# Patient Record
Sex: Male | Born: 1950 | Race: Black or African American | Hispanic: No | Marital: Single | Smoking: Current every day smoker
Health system: Southern US, Community
[De-identification: ages and names within clinical notes are randomized; demographics above are authoritative.]

## PROBLEM LIST (undated history)

## (undated) DIAGNOSIS — I10 Essential (primary) hypertension: Secondary | ICD-10-CM

## (undated) HISTORY — PX: OTHER SURGICAL HISTORY: SHX169

---

## 2005-03-23 ENCOUNTER — Emergency Department: Payer: Self-pay | Admitting: Emergency Medicine

## 2007-05-10 ENCOUNTER — Emergency Department: Payer: Self-pay | Admitting: Emergency Medicine

## 2007-05-12 ENCOUNTER — Ambulatory Visit: Payer: Self-pay | Admitting: Emergency Medicine

## 2011-01-14 ENCOUNTER — Emergency Department: Payer: Self-pay | Admitting: Emergency Medicine

## 2011-04-06 ENCOUNTER — Emergency Department: Payer: Self-pay | Admitting: Emergency Medicine

## 2011-08-26 ENCOUNTER — Emergency Department: Payer: Self-pay | Admitting: *Deleted

## 2013-02-14 ENCOUNTER — Emergency Department: Payer: Self-pay | Admitting: Emergency Medicine

## 2013-04-21 ENCOUNTER — Emergency Department: Payer: Self-pay | Admitting: Emergency Medicine

## 2013-04-21 LAB — COMPREHENSIVE METABOLIC PANEL
Bilirubin,Total: 0.9 mg/dL (ref 0.2–1.0)
Calcium, Total: 9.7 mg/dL (ref 8.5–10.1)
Chloride: 98 mmol/L (ref 98–107)
Co2: 29 mmol/L (ref 21–32)
Creatinine: 0.97 mg/dL (ref 0.60–1.30)
EGFR (African American): >60
Glucose: 92 mg/dL (ref 65–99)
Osmolality: 265 (ref 275–301)
SGOT(AST): 62 U/L — ABNORMAL HIGH (ref 15–37)
SGPT (ALT): 64 U/L (ref 12–78)
Sodium: 131 mmol/L — ABNORMAL LOW (ref 136–145)

## 2019-06-05 ENCOUNTER — Emergency Department
Admission: EM | Admit: 2019-06-05 | Discharge: 2019-06-05 | Disposition: A | Discharge status: Home / Self Care | Payer: Medicare Other | Attending: Emergency Medicine | Admitting: Emergency Medicine

## 2019-06-05 ENCOUNTER — Other Ambulatory Visit: Payer: Self-pay

## 2019-06-05 ENCOUNTER — Encounter: Payer: Self-pay | Admitting: Emergency Medicine

## 2019-06-05 DIAGNOSIS — Y93E1 Activity, personal bathing and showering: Secondary | ICD-10-CM | POA: Insufficient documentation

## 2019-06-05 DIAGNOSIS — Y92012 Bathroom of single-family (private) house as the place of occurrence of the external cause: Secondary | ICD-10-CM | POA: Diagnosis not present

## 2019-06-05 DIAGNOSIS — F17210 Nicotine dependence, cigarettes, uncomplicated: Secondary | ICD-10-CM | POA: Diagnosis not present

## 2019-06-05 DIAGNOSIS — W182XXA Fall in (into) shower or empty bathtub, initial encounter: Secondary | ICD-10-CM | POA: Insufficient documentation

## 2019-06-05 DIAGNOSIS — R0781 Pleurodynia: Secondary | ICD-10-CM | POA: Insufficient documentation

## 2019-06-05 DIAGNOSIS — Y998 Other external cause status: Secondary | ICD-10-CM | POA: Insufficient documentation

## 2019-06-05 DIAGNOSIS — R0789 Other chest pain: Secondary | ICD-10-CM

## 2019-06-05 DIAGNOSIS — W19XXXA Unspecified fall, initial encounter: Secondary | ICD-10-CM

## 2019-06-05 MED ORDER — MELOXICAM 15 MG PO TABS: 15.0000 mg | ORAL_TABLET | Freq: Every day | ORAL | 0 refills | Status: DC

## 2019-06-05 MED ORDER — TRAMADOL HCL 50 MG PO TABS
50.0000 mg | ORAL_TABLET | Freq: Once | ORAL | Status: AC
  Administered 2019-06-05: 50 mg via ORAL
  Filled 2019-06-05: qty 1

## 2019-06-05 MED ORDER — MELOXICAM 7.5 MG PO TABS
15.0000 mg | ORAL_TABLET | Freq: Once | ORAL | Status: AC
  Administered 2019-06-05: 22:00:00 15 mg via ORAL
  Filled 2019-06-05: qty 2

## 2019-06-05 NOTE — ED Triage Notes (Signed)
Pt presents to ED via EMS from home with right sided back pain after he fell 3 days ago in the bathroom. Pt states he was using the restroom and the rug slipped and he hit the right side of his back on the tub. Pt denies hitting his head or loc. Pt states he has been using Advil at home with good relief but reports he does not have any left. Pt ambulatory with steady gait. No distress noted.

## 2019-06-05 NOTE — ED Provider Notes (Signed)
[facility] Emergency Department Provider Note  ____________________________________________  Time seen: Approximately 9:45 PM  I have reviewed the triage vital signs and the nursing notes.   HISTORY  Chief Complaint Back Pain    HPI John Holden is a 68 y.o. male who presents the emergency department for evaluation of right-sided rib pain.  Patient reports that he was in the shower/bathtub a week ago when he fell landing on his right wrist.  Patient reports that he has had pain to the ribs for the past week.  He reports that it is improving every day.  Patient states that he has been taking Advil which is helped tremendously with the pain.  He states that he is here because he ran out of Advil and just "needs some medicine so I can go home and be comfortable."  Patient did not hit his head or lose consciousness.  He denies any shortness of breath or difficulty breathing.  No coughing.  Patient denies any abdominal pain, flank pain, hematuria.  No pain in bilateral hips.  No radicular symptoms.  No bowel or bladder dysfunction, saddle anesthesia or paresthesias.         History reviewed. No pertinent past medical history.  There are no active problems to display for this patient.   History reviewed. No pertinent surgical history.  Prior to Admission medications   Medication Sig Start Date End Date Taking? Authorizing Provider  meloxicam (MOBIC) 15 MG tablet Take 1 tablet (15 mg total) by mouth daily. 06/05/19   Jeralynn Vaquera, Delorise RoyalsJonathan D, PA-C    Allergies Patient has no known allergies.  No family history on file.  Social History Social History   Tobacco Use  . Smoking status: Current Every Day Smoker    Types: Cigarettes  . Smokeless tobacco: Never Used  Substance Use Topics  . Alcohol use: Yes    Comment: 40oz daily  . Drug use: Never     Review of Systems  Constitutional: No fever/chills Eyes: No visual changes. No discharge ENT: No upper  respiratory complaints. Cardiovascular: no chest pain. Respiratory: no cough. No SOB. Gastrointestinal: No abdominal pain.  No nausea, no vomiting.  No diarrhea.  No constipation. Genitourinary: Negative for dysuria. No hematuria Musculoskeletal: Positive for right rib pain Skin: Negative for rash, abrasions, lacerations, ecchymosis. Neurological: Negative for headaches, focal weakness or numbness. 10-point ROS otherwise negative.  ____________________________________________   PHYSICAL EXAM:  VITAL SIGNS: ED Triage Vitals  Enc Vitals Group     BP 06/05/19 2118 (!) 174/83     Pulse Rate 06/05/19 2118 96     Resp 06/05/19 2118 18     Temp 06/05/19 2118 97.9 F (36.6 C)     Temp Source 06/05/19 2118 Oral     SpO2 06/05/19 2118 98 %     Weight 06/05/19 2119 130 lb (59 kg)     Height 06/05/19 2119 5\' 7"  (1.702 m)     Head Circumference --      Peak Flow --      Pain Score 06/05/19 2119 8     Pain Loc --      Pain Edu? --      Excl. in GC? --      Constitutional: Alert and oriented. Well appearing and in no acute distress. Eyes: Conjunctivae are normal. PERRL. EOMI. Head: Atraumatic. ENT:      Ears:       Nose: No congestion/rhinnorhea.      Mouth/Throat: Mucous membranes  are moist.  Neck: No stridor.  No cervical spine tenderness to palpation.  Cardiovascular: Normal rate, regular rhythm. Normal S1 and S2.  Good peripheral circulation. Respiratory: Normal respiratory effort without tachypnea or retractions. Lungs CTAB. Good air entry to the bases with no decreased or absent breath sounds. Gastrointestinal: Bowel sounds 4 quadrants. Soft and nontender to palpation. No guarding or rigidity. No palpable masses. No distention. No CVA tenderness Musculoskeletal: Full range of motion to all extremities. No gross deformities appreciated.  Visualization of patient's ribs reveals no visible signs of trauma with open wounds, ecchymosis, edema, paradoxical chest wall movement.   Palpation reveals diffuse tenderness to palpation over the lateral ribs without point specific tenderness.  No palpable abnormality or crepitus.  No subcutaneous emphysema.  Good underlying breath sounds bilaterally. Neurologic:  Normal speech and language. No gross focal neurologic deficits are appreciated.  Skin:  Skin is warm, dry and intact. No rash noted. Psychiatric: Mood and affect are normal. Speech and behavior are normal. Patient exhibits appropriate insight and judgement.   ____________________________________________   LABS (all labs ordered are listed, but only abnormal results are displayed)  Labs Reviewed - No data to display ____________________________________________  EKG   ____________________________________________  RADIOLOGY   No results found.  ____________________________________________    PROCEDURES  Procedure(s) performed:    Procedures    Medications  traMADol (ULTRAM) tablet 50 mg (has no administration in time range)  meloxicam (MOBIC) tablet 15 mg (has no administration in time range)     ____________________________________________   INITIAL IMPRESSION / ASSESSMENT AND PLAN / ED COURSE  Pertinent labs & imaging results that were available during my care of the patient were reviewed by me and considered in my medical decision making (see chart for details).  Review of the [place] CSRS was performed in accordance of the [facility] prior to dispensing any controlled drugs.  Clinical Course as of Jun 04 2149  Sheral Flow Jun 05, 2019  2147 Patient presented to the emergency department for medication for his rib pain after falling a week ago.  Patient states that he has been taking Advil but he ran out.  Patient presents emergency department for medication to help with his pain.  Patient did not hit his head or lose consciousness.  No concerning signs or symptoms.  Exam is reassuring.  I recommended imaging to further evaluate the patient's ribs and the  patient declined stating that "I have kids waiting in the car and I need to leave."  Patient is requesting medication and discharge.  I have informed patient that I cannot fully assess him without any imaging.  Patient verbalizes understanding of the risk of not pursuing imaging.   [JC]    Clinical Course User Index [JC] Katheren Jimmerson, Delorise Royals, PA-C          Patient's diagnosis is consistent with rib pain from fall.  Patient presented to emergency department complaining of right rib pain.  Fall occurred a week ago.  Patient is just requesting medication as he ran out of Advil at home.  Overall exam is reassuring.  I recommended imaging but patient adamantly declines.  Patient states that he is here for medication only.  Patient is given meloxicam and Ultram in the emergency department.  Patient will be prescribed meloxicam at home.  I discussed the risk of foregoing further work-up and patient verbalizes understanding of same.  Patient still requests to leave.  Prescription provided for patient.  Follow-up primary care as needed..  Patient  is given ED precautions to return to the ED for any worsening or new symptoms.     ____________________________________________  FINAL CLINICAL IMPRESSION(S) / ED DIAGNOSES  Final diagnoses:  Rib pain on right side  Fall, initial encounter      NEW MEDICATIONS STARTED DURING THIS VISIT:  ED Discharge Orders         Ordered    meloxicam (MOBIC) 15 MG tablet  Daily     06/05/19 2148              This chart was dictated using voice recognition software/Dragon. Despite best efforts to proofread, errors can occur which can change the meaning. Any change was purely unintentional.    Darletta Moll, PA-C 06/05/19 2150    Nena Polio, MD 06/05/19 [phone]

## 2019-06-07 ENCOUNTER — Encounter: Payer: Self-pay | Admitting: Emergency Medicine

## 2019-06-07 ENCOUNTER — Other Ambulatory Visit: Payer: Self-pay

## 2019-06-07 ENCOUNTER — Emergency Department: Payer: Medicare Other

## 2019-06-07 ENCOUNTER — Emergency Department
Admission: EM | Admit: 2019-06-07 | Discharge: 2019-06-07 | Disposition: A | Discharge status: Home / Self Care | Payer: Medicare Other | Attending: Student | Admitting: Student

## 2019-06-07 DIAGNOSIS — F17210 Nicotine dependence, cigarettes, uncomplicated: Secondary | ICD-10-CM | POA: Diagnosis not present

## 2019-06-07 DIAGNOSIS — Z9114 Patient's other noncompliance with medication regimen: Secondary | ICD-10-CM | POA: Insufficient documentation

## 2019-06-07 DIAGNOSIS — Z79899 Other long term (current) drug therapy: Secondary | ICD-10-CM | POA: Diagnosis not present

## 2019-06-07 DIAGNOSIS — Z91199 Patient's noncompliance with other medical treatment and regimen due to unspecified reason: Secondary | ICD-10-CM

## 2019-06-07 DIAGNOSIS — Z9119 Patient's noncompliance with other medical treatment and regimen: Secondary | ICD-10-CM

## 2019-06-07 DIAGNOSIS — Y92012 Bathroom of single-family (private) house as the place of occurrence of the external cause: Secondary | ICD-10-CM | POA: Diagnosis not present

## 2019-06-07 DIAGNOSIS — S299XXA Unspecified injury of thorax, initial encounter: Secondary | ICD-10-CM | POA: Diagnosis present

## 2019-06-07 DIAGNOSIS — I10 Essential (primary) hypertension: Secondary | ICD-10-CM | POA: Diagnosis not present

## 2019-06-07 DIAGNOSIS — Y93E8 Activity, other personal hygiene: Secondary | ICD-10-CM | POA: Insufficient documentation

## 2019-06-07 DIAGNOSIS — Y999 Unspecified external cause status: Secondary | ICD-10-CM | POA: Diagnosis not present

## 2019-06-07 DIAGNOSIS — Z760 Encounter for issue of repeat prescription: Secondary | ICD-10-CM | POA: Diagnosis not present

## 2019-06-07 DIAGNOSIS — S2231XA Fracture of one rib, right side, initial encounter for closed fracture: Secondary | ICD-10-CM | POA: Diagnosis not present

## 2019-06-07 DIAGNOSIS — W01198A Fall on same level from slipping, tripping and stumbling with subsequent striking against other object, initial encounter: Secondary | ICD-10-CM | POA: Diagnosis not present

## 2019-06-07 LAB — COMPREHENSIVE METABOLIC PANEL
ALT: 42 U/L (ref 0–44)
AST: 115 U/L — ABNORMAL HIGH (ref 15–41)
Albumin: 2.8 g/dL — ABNORMAL LOW (ref 3.5–5.0)
Alkaline Phosphatase: 60 U/L (ref 38–126)
Anion gap: 12 (ref 5–15)
BUN: 9 mg/dL (ref 8–23)
CO2: 21 mmol/L — ABNORMAL LOW (ref 22–32)
Calcium: 8.7 mg/dL — ABNORMAL LOW (ref 8.9–10.3)
Chloride: 105 mmol/L (ref 98–111)
Creatinine, Ser: 0.88 mg/dL (ref 0.61–1.24)
GFR calc Af Amer: >60 mL/min (ref >60)
GFR calc non Af Amer: >60 mL/min (ref >60)
Glucose, Bld: 101 mg/dL — ABNORMAL HIGH (ref 70–99)
Potassium: 4 mmol/L (ref 3.5–5.1)
Sodium: 138 mmol/L (ref 135–145)
Total Bilirubin: 4.4 mg/dL — ABNORMAL HIGH (ref 0.3–1.2)
Total Protein: 8.1 g/dL (ref 6.5–8.1)

## 2019-06-07 LAB — URINALYSIS, COMPLETE (UACMP) WITH MICROSCOPIC
Bacteria, UA: NONE SEEN
Bilirubin Urine: NEGATIVE
Glucose, UA: NEGATIVE mg/dL
Hgb urine dipstick: NEGATIVE
Ketones, ur: 20 mg/dL — AB
Leukocytes,Ua: NEGATIVE
Nitrite: NEGATIVE
Protein, ur: NEGATIVE mg/dL
Specific Gravity, Urine: 1.021 (ref 1.005–1.030)
pH: 5 (ref 5.0–8.0)

## 2019-06-07 LAB — CBC WITH DIFFERENTIAL/PLATELET
Abs Immature Granulocytes: 0.02 10*3/uL (ref 0.00–0.07)
Basophils Absolute: 0 10*3/uL (ref 0.0–0.1)
Basophils Relative: 0 %
Eosinophils Absolute: 0 10*3/uL (ref 0.0–0.5)
Eosinophils Relative: 1 %
HCT: 32.4 % — ABNORMAL LOW (ref 39.0–52.0)
Hemoglobin: 10.5 g/dL — ABNORMAL LOW (ref 13.0–17.0)
Immature Granulocytes: 0 %
Lymphocytes Relative: 17 %
Lymphs Abs: 1 10*3/uL (ref 0.7–4.0)
MCH: 30.2 pg (ref 26.0–34.0)
MCHC: 32.4 g/dL (ref 30.0–36.0)
MCV: 93.1 fL (ref 80.0–100.0)
Monocytes Absolute: 0.7 10*3/uL (ref 0.1–1.0)
Monocytes Relative: 12 %
Neutro Abs: 4.1 10*3/uL (ref 1.7–7.7)
Neutrophils Relative %: 70 %
Platelets: 58 10*3/uL — ABNORMAL LOW (ref 150–400)
RBC: 3.48 MIL/uL — ABNORMAL LOW (ref 4.22–5.81)
RDW: 21.6 % — ABNORMAL HIGH (ref 11.5–15.5)
Smear Review: NORMAL
WBC: 5.9 10*3/uL (ref 4.0–10.5)
nRBC: 0 % (ref 0.0–0.2)

## 2019-06-07 LAB — LIPASE, BLOOD: Lipase: 35 U/L (ref 11–51)

## 2019-06-07 MED ORDER — TRAMADOL HCL 50 MG PO TABS: 50.0000 mg | ORAL_TABLET | Freq: Four times a day (QID) | ORAL | 0 refills | Status: DC | PRN

## 2019-06-07 MED ORDER — HYDROCHLOROTHIAZIDE 25 MG PO TABS: 25.0000 mg | ORAL_TABLET | Freq: Every day | ORAL | 1 refills | Status: DC

## 2019-06-07 NOTE — Discharge Instructions (Signed)
Call today make an appointment with your primary care provider at [facility] as listed inside your chart.  You need to establish a primary care provider and follow up with them 3-4 times a year due to your hypertension and elevated liver enzymes.  The pressure medication prescribed for you at this time is temporary until you can see your primary care provider at which time most likely they will change her blood pressure medication to something different.  Pain medication is to be taken only as directed every 6 hours if needed for pain.  If any continued pain medication is needed you will need to see your primary care provider.

## 2019-06-07 NOTE — ED Triage Notes (Addendum)
Seen here for same 2 days ago.  No better.  Right lower rib pain.  No cough  No fever  Also is out of bp meds.

## 2019-06-07 NOTE — ED Provider Notes (Signed)
[facility] Emergency Department Provider Note  ____________________________________________   First MD Initiated Contact with Patient 06/07/19 1117     (approximate)  I have reviewed the triage vital signs and the nursing notes.   HISTORY  Chief Complaint Rib Injury and Medication Refill   HPI John Holden is a 68 y.o. male presents to the ED with complaint of right rib pain.  Patient states that he was here 2 days ago did not stay for evaluation of a possible fractured rib.  He was given a prescription for meloxicam which he states has not helped with his pain.  He also is requesting that his blood pressure medication be refilled.  He does not know the name of his blood pressure medication.  He has not seen his PCP in over a year.  He currently denies any symptoms of chest pain, shortness of breath or headache.  He rates his rib pain as a 7 out of 10.        History reviewed. No pertinent past medical history.  There are no active problems to display for this patient.   History reviewed. No pertinent surgical history.  Prior to Admission medications   Medication Sig Start Date End Date Taking? Authorizing Provider  hydrochlorothiazide (HYDRODIURIL) 25 MG tablet Take 1 tablet (25 mg total) by mouth daily. 06/07/19   John Holden, John Cloe L, PA-C  meloxicam (MOBIC) 15 MG tablet Take 1 tablet (15 mg total) by mouth daily. 06/05/19   Cuthriell, John RoyalsJonathan D, PA-C  traMADol (ULTRAM) 50 MG tablet Take 1 tablet (50 mg total) by mouth every 6 (six) hours as needed. 06/07/19   John Holden, John Dolney L, PA-C    Allergies Patient has no known allergies.  No family history on file.  Social History Social History   Tobacco Use   Smoking status: Current Every Day Smoker    Types: Cigarettes   Smokeless tobacco: Never Used  Substance Use Topics   Alcohol use: Yes    Comment: 40oz daily   Drug use: Never    Review of Systems Constitutional: No fever/chills Eyes:  No visual changes. Cardiovascular: Denies chest pain. Respiratory: Denies shortness of breath.   Gastrointestinal: No abdominal pain.  No nausea, no vomiting.  Musculoskeletal: Positive for right rib pain. Skin: Negative for rash. Neurological: Negative for headaches, focal weakness or numbness. ___________________________________________   PHYSICAL EXAM:  VITAL SIGNS: ED Triage Vitals  Enc Vitals Group     BP 06/07/19 1105 (!) 196/76     Pulse Rate 06/07/19 1105 (!) 106     Resp 06/07/19 1105 16     Temp 06/07/19 1105 99 F (37.2 C)     Temp Source 06/07/19 1105 Oral     SpO2 06/07/19 1105 100 %     Weight 06/07/19 1107 130 lb 1.1 oz (59 kg)     Height 06/07/19 1107 5\' 7"  (1.702 m)     Head Circumference --      Peak Flow --      Pain Score 06/07/19 1105 7     Pain Loc --      Pain Edu? --      Excl. in GC? --    Constitutional: Alert and oriented. Well appearing and in no acute distress. Eyes: Conjunctivae are normal.  Head: Atraumatic. Neck: No stridor.   Cardiovascular: Normal rate, regular rhythm. Grossly normal heart sounds.  Good peripheral circulation. Respiratory: Normal respiratory effort.  No retractions. Lungs CTAB.  Moderate tenderness on  palpation of the right lateral rib approximately 10th or 11th without deformity noted.  There is no ecchymosis or abrasions seen.  There is no soft tissue edema present. Gastrointestinal: Soft and nontender. No distention.  Musculoskeletal: Moves upper and lower extremities without any difficulty normal gait was noted.  No tenderness is noted on palpation of the thoracic spine. Neurologic:  Normal speech and language. No gross focal neurologic deficits are appreciated. No gait instability. Skin:  Skin is warm, dry and intact. No rash noted. Psychiatric: Mood and affect are normal. Speech and behavior are normal.  ____________________________________________   LABS (all labs ordered are listed, but only abnormal results are  displayed)  Labs Reviewed  CBC WITH DIFFERENTIAL/PLATELET - Abnormal; Notable for the following components:      Result Value   RBC 3.48 (*)    Hemoglobin 10.5 (*)    HCT 32.4 (*)    RDW 21.6 (*)    Platelets 58 (*)    All other components within normal limits  COMPREHENSIVE METABOLIC PANEL - Abnormal; Notable for the following components:   CO2 21 (*)    Glucose, Bld 101 (*)    Calcium 8.7 (*)    Albumin 2.8 (*)    AST 115 (*)    Total Bilirubin 4.4 (*)    All other components within normal limits  URINALYSIS, COMPLETE (UACMP) WITH MICROSCOPIC - Abnormal; Notable for the following components:   Color, Urine AMBER (*)    APPearance CLEAR (*)    Ketones, ur 20 (*)    All other components within normal limits  LIPASE, BLOOD   ____________________________________________  EKG EKG was reviewed by Dr. Mayford Knife.  Normal sinus rhythm with a ventricular rate of 98, PR interval 182, QRS duration 86  ____________________________________________  RADIOLOGY  Official radiology report(s): Dg Ribs Unilateral W/chest Right  Result Date: 06/07/2019 CLINICAL DATA:  Pain following fall EXAM: RIGHT RIBS AND CHEST - 3+ VIEW COMPARISON:  None. FINDINGS: Frontal chest as well as oblique and cone-down rib images obtained. There is a nondisplaced fracture of the right eleventh rib. No other fracture evident. There is a small right pleural effusion. No appreciable pneumothorax. There is mild apparent scarring in the lateral left base. No edema or consolidation. Heart size and pulmonary vascularity are normal. There is aortic atherosclerosis. There is an old healed fracture of the left clavicle. There are gallstones within the gallbladder. IMPRESSION: 1. Nondisplaced fracture right eleventh rib with small right pleural effusion. No evident pneumothorax. 2. Mild scarring lateral left lung base. No edema or consolidation. 3.  Aortic Atherosclerosis (ICD10-I70.0). 4.  Cholelithiasis noted. Electronically  Signed   By: Bretta Bang III M.Holden.   On: 06/07/2019 12:34    ____________________________________________   PROCEDURES  Procedure(s) performed (including Critical Care):  Procedures  ___________________________________________   INITIAL IMPRESSION / ASSESSMENT AND PLAN / ED COURSE  As part of my medical decision making, I reviewed the following data within the electronic MEDICAL RECORD NUMBER Notes from prior ED visits and [place] Controlled Substance Database   68 year old male presents to the ED with complaint of right lateral rib pain for several days.  He was seen 2 days ago at which time he did not stay to have his rib x-ray.  He was prescribed meloxicam which he states is not helping with his pain.  He also at this time is hypertensive and has not taken his blood pressure medication in over a year.  He is also not seen his PCP  in that length of time and therefore does not have refills on his medication.  He does not know the name of his medication nor does he have the bottle.  Initial blood pressure was 196/76.  He denied any symptoms.  After reviewing his lab work he admits that he drinks every day and increases the amount on the weekends.  Patient was made aware that he has some liver function that is elevated and that he needs to follow-up with his PCP.  Patient again states that he is only here to have his right rib x-rayed.  Patient was made aware that he does have a nondisplaced fracture of his 11th rib.  Patient was given a prescription for hydrochlorothiazide 25 mg 1 daily and made aware that this most likely is not the blood pressure medication that he was taking.  He was also given tramadol 50 mg 1 every 6 hours as needed for pain.  Also ice can be applied to his ribs.  He is strongly encouraged to make an appointment with his doctor.  ____________________________________________   FINAL CLINICAL IMPRESSION(S) / ED DIAGNOSES  Final diagnoses:  Closed fracture of one rib of right  side, initial encounter  Medically noncompliant  Hypertension, uncontrolled     ED Discharge Orders         Ordered    traMADol (ULTRAM) 50 MG tablet  Every 6 hours PRN     06/07/19 1318    hydrochlorothiazide (HYDRODIURIL) 25 MG tablet  Daily     06/07/19 1318           Note:  This document was prepared using Dragon voice recognition software and may include unintentional dictation errors.    Johnn Hai, PA-C 06/07/19 1809    Lilia Pro., MD 06/08/19 [phone]

## 2019-06-07 NOTE — ED Notes (Signed)
Pt also report needing refill for BP medication. Unable to remember name of medication.

## 2019-08-31 ENCOUNTER — Other Ambulatory Visit: Payer: Self-pay | Admitting: Physician Assistant

## 2019-08-31 DIAGNOSIS — B182 Chronic viral hepatitis C: Secondary | ICD-10-CM

## 2019-08-31 DIAGNOSIS — R748 Abnormal levels of other serum enzymes: Secondary | ICD-10-CM

## 2019-08-31 DIAGNOSIS — F1010 Alcohol abuse, uncomplicated: Secondary | ICD-10-CM

## 2019-12-15 ENCOUNTER — Emergency Department: Payer: Medicare Other

## 2019-12-15 ENCOUNTER — Other Ambulatory Visit: Payer: Self-pay

## 2019-12-15 ENCOUNTER — Inpatient Hospital Stay
Admission: EM | Admit: 2019-12-15 | Discharge: 2019-12-19 | DRG: 683 | Disposition: A | Discharge status: Home Health | Payer: Medicare Other | Attending: Internal Medicine | Admitting: Internal Medicine

## 2019-12-15 DIAGNOSIS — Z8249 Family history of ischemic heart disease and other diseases of the circulatory system: Secondary | ICD-10-CM

## 2019-12-15 DIAGNOSIS — D61818 Other pancytopenia: Secondary | ICD-10-CM | POA: Diagnosis not present

## 2019-12-15 DIAGNOSIS — E876 Hypokalemia: Secondary | ICD-10-CM

## 2019-12-15 DIAGNOSIS — F17210 Nicotine dependence, cigarettes, uncomplicated: Secondary | ICD-10-CM | POA: Diagnosis present

## 2019-12-15 DIAGNOSIS — Z20822 Contact with and (suspected) exposure to covid-19: Secondary | ICD-10-CM | POA: Diagnosis present

## 2019-12-15 DIAGNOSIS — T39395A Adverse effect of other nonsteroidal anti-inflammatory drugs [NSAID], initial encounter: Secondary | ICD-10-CM | POA: Diagnosis present

## 2019-12-15 DIAGNOSIS — Z791 Long term (current) use of non-steroidal anti-inflammatories (NSAID): Secondary | ICD-10-CM

## 2019-12-15 DIAGNOSIS — Z79899 Other long term (current) drug therapy: Secondary | ICD-10-CM | POA: Diagnosis not present

## 2019-12-15 DIAGNOSIS — T40425A Adverse effect of tramadol, initial encounter: Secondary | ICD-10-CM | POA: Diagnosis present

## 2019-12-15 DIAGNOSIS — T502X5A Adverse effect of carbonic-anhydrase inhibitors, benzothiadiazides and other diuretics, initial encounter: Secondary | ICD-10-CM | POA: Diagnosis present

## 2019-12-15 DIAGNOSIS — B182 Chronic viral hepatitis C: Secondary | ICD-10-CM | POA: Diagnosis present

## 2019-12-15 DIAGNOSIS — I959 Hypotension, unspecified: Secondary | ICD-10-CM | POA: Diagnosis not present

## 2019-12-15 DIAGNOSIS — G629 Polyneuropathy, unspecified: Secondary | ICD-10-CM | POA: Diagnosis present

## 2019-12-15 DIAGNOSIS — N170 Acute kidney failure with tubular necrosis: Principal | ICD-10-CM | POA: Diagnosis present

## 2019-12-15 DIAGNOSIS — I10 Essential (primary) hypertension: Secondary | ICD-10-CM | POA: Diagnosis not present

## 2019-12-15 DIAGNOSIS — N179 Acute kidney failure, unspecified: Secondary | ICD-10-CM | POA: Diagnosis present

## 2019-12-15 DIAGNOSIS — D631 Anemia in chronic kidney disease: Secondary | ICD-10-CM | POA: Diagnosis not present

## 2019-12-15 DIAGNOSIS — N1832 Chronic kidney disease, stage 3b: Secondary | ICD-10-CM | POA: Diagnosis not present

## 2019-12-15 HISTORY — DX: Essential (primary) hypertension: I10

## 2019-12-15 LAB — BASIC METABOLIC PANEL
Anion gap: 13 (ref 5–15)
BUN: 22 mg/dL (ref 8–23)
CO2: 26 mmol/L (ref 22–32)
Calcium: 7.3 mg/dL — ABNORMAL LOW (ref 8.9–10.3)
Chloride: 98 mmol/L (ref 98–111)
Creatinine, Ser: 4.53 mg/dL — ABNORMAL HIGH (ref 0.61–1.24)
GFR calc Af Amer: 14 mL/min — ABNORMAL LOW (ref >60)
GFR calc non Af Amer: 12 mL/min — ABNORMAL LOW (ref >60)
Glucose, Bld: 112 mg/dL — ABNORMAL HIGH (ref 70–99)
Potassium: <2 mmol/L — CL (ref 3.5–5.1)
Sodium: 137 mmol/L (ref 135–145)

## 2019-12-15 LAB — MAGNESIUM: Magnesium: 2.1 mg/dL (ref 1.7–2.4)

## 2019-12-15 LAB — POTASSIUM: Potassium: 2.3 mmol/L — CL (ref 3.5–5.1)

## 2019-12-15 LAB — HEPATIC FUNCTION PANEL
ALT: 50 U/L — ABNORMAL HIGH (ref 0–44)
AST: 144 U/L — ABNORMAL HIGH (ref 15–41)
Albumin: 2.2 g/dL — ABNORMAL LOW (ref 3.5–5.0)
Alkaline Phosphatase: 94 U/L (ref 38–126)
Bilirubin, Direct: 1.6 mg/dL — ABNORMAL HIGH (ref 0.0–0.2)
Indirect Bilirubin: 1.5 mg/dL — ABNORMAL HIGH (ref 0.3–0.9)
Total Bilirubin: 3.1 mg/dL — ABNORMAL HIGH (ref 0.3–1.2)
Total Protein: 7.1 g/dL (ref 6.5–8.1)

## 2019-12-15 LAB — CBC
HCT: 25 % — ABNORMAL LOW (ref 39.0–52.0)
Hemoglobin: 8.8 g/dL — ABNORMAL LOW (ref 13.0–17.0)
MCH: 29.7 pg (ref 26.0–34.0)
MCHC: 35.2 g/dL (ref 30.0–36.0)
MCV: 84.5 fL (ref 80.0–100.0)
Platelets: 124 10*3/uL — ABNORMAL LOW (ref 150–400)
RBC: 2.96 MIL/uL — ABNORMAL LOW (ref 4.22–5.81)
RDW: 17 % — ABNORMAL HIGH (ref 11.5–15.5)
WBC: 11.1 10*3/uL — ABNORMAL HIGH (ref 4.0–10.5)
nRBC: 0 % (ref 0.0–0.2)

## 2019-12-15 LAB — TSH: TSH: 0.886 u[IU]/mL (ref 0.350–4.500)

## 2019-12-15 LAB — PROTIME-INR
INR: 1.7 — ABNORMAL HIGH (ref 0.8–1.2)
Prothrombin Time: 20.1 seconds — ABNORMAL HIGH (ref 11.4–15.2)

## 2019-12-15 LAB — SARS CORONAVIRUS 2 (TAT 6-24 HRS): SARS Coronavirus 2: NEGATIVE

## 2019-12-15 MED ORDER — POTASSIUM CHLORIDE 10 MEQ/100ML IV SOLN
10.0000 meq | Freq: Once | INTRAVENOUS | Status: AC
  Administered 2019-12-15: 10 meq via INTRAVENOUS
  Filled 2019-12-15: qty 100

## 2019-12-15 MED ORDER — SODIUM CHLORIDE 0.9 % IV SOLN: Freq: Once | INTRAVENOUS | Status: AC

## 2019-12-15 MED ORDER — POTASSIUM CHLORIDE CRYS ER 20 MEQ PO TBCR
40.0000 meq | EXTENDED_RELEASE_TABLET | Freq: Once | ORAL | Status: AC
  Administered 2019-12-15: 40 meq via ORAL
  Filled 2019-12-15: qty 2

## 2019-12-15 MED ORDER — POTASSIUM CHLORIDE 10 MEQ/100ML IV SOLN
10.0000 meq | INTRAVENOUS | Status: AC
  Administered 2019-12-15 – 2019-12-16 (×6): 10 meq via INTRAVENOUS
  Filled 2019-12-15 (×6): qty 100

## 2019-12-15 MED ORDER — ONDANSETRON HCL 4 MG/2ML IJ SOLN: 4.0000 mg | Freq: Four times a day (QID) | INTRAMUSCULAR | Status: DC | PRN

## 2019-12-15 MED ORDER — TRAZODONE HCL 50 MG PO TABS
25.0000 mg | ORAL_TABLET | Freq: Every evening | ORAL | Status: DC | PRN
  Administered 2019-12-17: 25 mg via ORAL
  Filled 2019-12-15: qty 1

## 2019-12-15 MED ORDER — HEPARIN SODIUM (PORCINE) 5000 UNIT/ML IJ SOLN
5000.0000 [IU] | Freq: Three times a day (TID) | INTRAMUSCULAR | Status: DC
  Administered 2019-12-15 – 2019-12-18 (×8): 5000 [IU] via SUBCUTANEOUS
  Filled 2019-12-15 (×9): qty 1

## 2019-12-15 MED ORDER — SODIUM CHLORIDE 0.9 % IV SOLN: INTRAVENOUS | Status: DC

## 2019-12-15 MED ORDER — POTASSIUM CHLORIDE CRYS ER 20 MEQ PO TBCR
40.0000 meq | EXTENDED_RELEASE_TABLET | Freq: Once | ORAL | Status: AC
  Administered 2019-12-15: 40 meq via ORAL

## 2019-12-15 MED ORDER — DOCUSATE SODIUM 100 MG PO CAPS
100.0000 mg | ORAL_CAPSULE | Freq: Two times a day (BID) | ORAL | Status: DC
  Administered 2019-12-15 – 2019-12-19 (×6): 100 mg via ORAL
  Filled 2019-12-15 (×7): qty 1

## 2019-12-15 MED ORDER — BISACODYL 5 MG PO TBEC: 5.0000 mg | DELAYED_RELEASE_TABLET | Freq: Every day | ORAL | Status: DC | PRN

## 2019-12-15 MED ORDER — MAGNESIUM SULFATE 2 GM/50ML IV SOLN
2.0000 g | Freq: Once | INTRAVENOUS | Status: AC
  Administered 2019-12-15: 2 g via INTRAVENOUS
  Filled 2019-12-15: qty 50

## 2019-12-15 MED ORDER — ONDANSETRON HCL 4 MG PO TABS: 4.0000 mg | ORAL_TABLET | Freq: Four times a day (QID) | ORAL | Status: DC | PRN

## 2019-12-15 NOTE — Consult Note (Signed)
PHARMACY CONSULT NOTE - FOLLOW UP  Pharmacy Consult for Electrolyte Monitoring and Replacement   Recent Labs: Potassium (mmol/L)  Date Value  12/15/2019 2.3 (LL)  04/21/2013 4.6   Magnesium (mg/dL)  Date Value  27/51/7001 2.1   Calcium (mg/dL)  Date Value  74/94/4967 7.3 (L)   Calcium, Total (mg/dL)  Date Value  59/16/3846 9.7   Albumin (g/dL)  Date Value  65/99/3570 2.2 (L)  04/21/2013 3.7   Sodium (mmol/L)  Date Value  12/15/2019 137  04/21/2013 131 (L)     Assessment: Pharmacy has been consulted to monitor and replace electrolytes in 68yo patient with acute renal failure and hypokalemia.  Patient received KCl IV x 2 doses as well as KCl PO total after initial lab of K<2. Repeat K level was 2.3  Goal of Therapy:  Electrolytes WNL  Plan:  Will order 6 more additional bags of KCl IV.  Will follow up with AM labs and replete as necessary.  Bettey Costa ,PharmD Clinical Pharmacist 12/15/2019 8:23 PM
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
## 2019-12-15 NOTE — ED Notes (Signed)
Dinner tray provided.patient awaiting bed status . Family went home aware patient is being admitted.

## 2019-12-15 NOTE — ED Notes (Signed)
Patient off unit to U?S then xray.

## 2019-12-15 NOTE — ED Notes (Signed)
Family at bedside. Patient given meal and something to drink. Labs redrawn and sent.KRider infusing. Awaiting bed status.

## 2019-12-15 NOTE — ED Notes (Signed)
Pt able to use urinal independently. Urine collected and sent off to lab.

## 2019-12-15 NOTE — ED Notes (Signed)
Report to receiving nurse.

## 2019-12-15 NOTE — ED Triage Notes (Addendum)
Pt comes via POV from PCP with c/o abnormal lab result.  Pt states it was his liver test. Pt states he received call today from, PCP and was informed to come here. Pt states he does take liver pill once a day. Pt states some swelling to bilateral lower extremities. Pt states they have gone down since Monday.  Pt denies any CP or SOB.

## 2019-12-16 LAB — COMPREHENSIVE METABOLIC PANEL
ALT: 51 U/L — ABNORMAL HIGH (ref 0–44)
AST: 168 U/L — ABNORMAL HIGH (ref 15–41)
Albumin: 2 g/dL — ABNORMAL LOW (ref 3.5–5.0)
Alkaline Phosphatase: 97 U/L (ref 38–126)
Anion gap: 12 (ref 5–15)
BUN: 21 mg/dL (ref 8–23)
CO2: 21 mmol/L — ABNORMAL LOW (ref 22–32)
Calcium: 6.9 mg/dL — ABNORMAL LOW (ref 8.9–10.3)
Chloride: 102 mmol/L (ref 98–111)
Creatinine, Ser: 3.75 mg/dL — ABNORMAL HIGH (ref 0.61–1.24)
GFR calc Af Amer: 18 mL/min — ABNORMAL LOW (ref >60)
GFR calc non Af Amer: 15 mL/min — ABNORMAL LOW (ref >60)
Glucose, Bld: 135 mg/dL — ABNORMAL HIGH (ref 70–99)
Potassium: 2.4 mmol/L — CL (ref 3.5–5.1)
Sodium: 135 mmol/L (ref 135–145)
Total Bilirubin: 2.8 mg/dL — ABNORMAL HIGH (ref 0.3–1.2)
Total Protein: 6.6 g/dL (ref 6.5–8.1)

## 2019-12-16 LAB — GLUCOSE, CAPILLARY: Glucose-Capillary: 86 mg/dL (ref 70–99)

## 2019-12-16 LAB — POTASSIUM: Potassium: 3.1 mmol/L — ABNORMAL LOW (ref 3.5–5.1)

## 2019-12-16 LAB — CBC
HCT: 24.7 % — ABNORMAL LOW (ref 39.0–52.0)
Hemoglobin: 8.2 g/dL — ABNORMAL LOW (ref 13.0–17.0)
MCH: 29.1 pg (ref 26.0–34.0)
MCHC: 33.2 g/dL (ref 30.0–36.0)
MCV: 87.6 fL (ref 80.0–100.0)
Platelets: 115 10*3/uL — ABNORMAL LOW (ref 150–400)
RBC: 2.82 MIL/uL — ABNORMAL LOW (ref 4.22–5.81)
RDW: 17.1 % — ABNORMAL HIGH (ref 11.5–15.5)
WBC: 10.1 10*3/uL (ref 4.0–10.5)
nRBC: 0.2 % (ref 0.0–0.2)

## 2019-12-16 LAB — PHOSPHORUS: Phosphorus: 3.5 mg/dL (ref 2.5–4.6)

## 2019-12-16 LAB — HIV ANTIBODY (ROUTINE TESTING W REFLEX): HIV Screen 4th Generation wRfx: NONREACTIVE

## 2019-12-16 LAB — MAGNESIUM: Magnesium: 1.8 mg/dL (ref 1.7–2.4)

## 2019-12-16 MED ORDER — POTASSIUM CHLORIDE CRYS ER 20 MEQ PO TBCR
20.0000 meq | EXTENDED_RELEASE_TABLET | Freq: Once | ORAL | Status: AC
  Administered 2019-12-16: 20 meq via ORAL
  Filled 2019-12-16: qty 1

## 2019-12-16 MED ORDER — POTASSIUM CHLORIDE CRYS ER 20 MEQ PO TBCR
40.0000 meq | EXTENDED_RELEASE_TABLET | Freq: Once | ORAL | Status: AC
  Administered 2019-12-16: 40 meq via ORAL
  Filled 2019-12-16: qty 2

## 2019-12-16 MED ORDER — MAGNESIUM SULFATE IN D5W 1-5 GM/100ML-% IV SOLN
1.0000 g | Freq: Once | INTRAVENOUS | Status: AC
  Administered 2019-12-16: 1 g via INTRAVENOUS
  Filled 2019-12-16: qty 100

## 2019-12-16 MED ORDER — POTASSIUM CHLORIDE 10 MEQ/100ML IV SOLN
10.0000 meq | INTRAVENOUS | Status: AC
  Administered 2019-12-16 (×6): 10 meq via INTRAVENOUS
  Filled 2019-12-16 (×4): qty 100

## 2019-12-16 NOTE — Progress Notes (Signed)
Nurse practioner [place] notified potassium 2.3

## 2019-12-16 NOTE — Consult Note (Signed)
PHARMACY CONSULT NOTE - FOLLOW UP  Pharmacy Consult for Electrolyte Monitoring and Replacement   Recent Labs: Potassium (mmol/L)  Date Value  12/16/2019 2.4 (LL)  04/21/2013 4.6   Magnesium (mg/dL)  Date Value  06/00/4599 1.8   Calcium (mg/dL)  Date Value  77/41/4239 6.9 (L)   Calcium, Total (mg/dL)  Date Value  53/20/2334 9.7   Albumin (g/dL)  Date Value  35/68/6168 2.0 (L)  04/21/2013 3.7   Phosphorus (mg/dL)  Date Value  37/29/0211 3.5   Sodium (mmol/L)  Date Value  12/16/2019 135  04/21/2013 131 (L)     Assessment: Pharmacy has been consulted to monitor and replace electrolytes in 68yo patient with acute renal failure and hypokalemia.  Goal of Therapy:  Electrolytes WNL  Plan:  K= 2.4 - Will order 6 more additional bags of KCl IV. Mg = 1.8 - Will replete with Magnesium bolus 1g IV x 1   Will follow-up potassium level @ 2000  Will follow up with AM labs and replete as necessary.  Albina Billet ,PharmD Clinical Pharmacist 12/16/2019 11:33 AM

## 2019-12-16 NOTE — Consult Note (Addendum)
PHARMACY CONSULT NOTE - FOLLOW UP  Pharmacy Consult for Electrolyte Monitoring and Replacement   Recent Labs: Potassium (mmol/L)  Date Value  12/16/2019 3.1 (L)  04/21/2013 4.6   Magnesium (mg/dL)  Date Value  66/64/8616 1.8   Calcium (mg/dL)  Date Value  09/20/17 6.9 (L)   Calcium, Total (mg/dL)  Date Value  09/70/4492 9.7   Albumin (g/dL)  Date Value  52/41/5901 2.0 (L)  04/21/2013 3.7   Phosphorus (mg/dL)  Date Value  72/41/9542 3.5   Sodium (mmol/L)  Date Value  12/16/2019 135  04/21/2013 131 (L)     Assessment: Pharmacy has been consulted to monitor and replace electrolytes in 69yo patient with acute renal failure and hypokalemia.  Goal of Therapy:  Electrolytes WNL  Plan:  K= 2.4 - Will order 6 more additional bags of KCl IV. Mg = 1.8 - Will replete with Magnesium bolus 1g IV x 1   Will follow-up potassium level @ 2000  Will follow up with AM labs and replete as necessary.  Update @ 2118:  K 3.1 (last potassium infusion still running). Typically would check potassium 1 hour after last dose. Given K remains low, will gently replace potassium with 20 mEq PO tablet x1 dose and recheck potassium with AM labs.     Katha Cabal ,PharmD Clinical Pharmacist 12/16/2019 9:17 PM

## 2019-12-16 NOTE — Progress Notes (Signed)
[place] at [place] NAME: John Holden    MR#:  [id number]  DATE OF BIRTH:  20-Dec-1950  SUBJECTIVE:  CHIEF COMPLAINT:   Chief Complaint  Patient presents with  . abnormal lab  No new complaints.  Potassium 2.4 REVIEW OF SYSTEMS:  Review of Systems  Constitutional: Positive for malaise/fatigue. Negative for diaphoresis, fever and weight loss.  HENT: Negative for ear discharge, ear pain, hearing loss, nosebleeds, sore throat and tinnitus.   Eyes: Negative for blurred vision and pain.  Respiratory: Negative for cough, hemoptysis, shortness of breath and wheezing.   Cardiovascular: Negative for chest pain, palpitations, orthopnea and leg swelling.  Gastrointestinal: Negative for abdominal pain, blood in stool, constipation, diarrhea, heartburn, nausea and vomiting.  Genitourinary: Negative for dysuria, frequency and urgency.  Musculoskeletal: Negative for back pain and myalgias.  Skin: Negative for itching and rash.  Neurological: Negative for dizziness, tingling, tremors, focal weakness, seizures, weakness and headaches.  Psychiatric/Behavioral: Negative for depression. The patient is not nervous/anxious.    DRUG ALLERGIES:  No Known Allergies VITALS:  Blood pressure (!) 102/47, pulse 66, temperature 97.7 F (36.5 C), temperature source Oral, resp. rate 16, height 5\' 7"  (1.702 m), weight 68.4 kg, SpO2 100 %. PHYSICAL EXAMINATION:  Physical Exam HENT:     Head: Normocephalic and atraumatic.  Eyes:     Conjunctiva/sclera: Conjunctivae normal.     Pupils: Pupils are equal, round, and reactive to light.  Neck:     Thyroid: No thyromegaly.     Trachea: No tracheal deviation.  Cardiovascular:     Rate and Rhythm: Normal rate and regular rhythm.     Heart sounds: Normal heart sounds.  Pulmonary:     Effort: Pulmonary effort is normal. No respiratory distress.     Breath sounds: Normal breath sounds. No wheezing.  Chest:     Chest wall: No  tenderness.  Abdominal:     General: Bowel sounds are normal. There is no distension.     Palpations: Abdomen is soft.     Tenderness: There is no abdominal tenderness.  Musculoskeletal:        General: Normal range of motion.     Cervical back: Normal range of motion and neck supple.  Skin:    General: Skin is warm and dry.     Findings: No rash.  Neurological:     Mental Status: He is alert and oriented to person, place, and time.     Cranial Nerves: No cranial nerve deficit.    LABORATORY PANEL:  Male CBC Recent Labs  Lab 12/16/19 0426  WBC 10.1  HGB 8.2*  HCT 24.7*  PLT 115*   ------------------------------------------------------------------------------------------------------------------ Chemistries  Recent Labs  Lab 12/16/19 0426  NA 135  K 2.4*  CL 102  CO2 21*  GLUCOSE 135*  BUN 21  CREATININE 3.75*  CALCIUM 6.9*  MG 1.8  AST 168*  ALT 51*  ALKPHOS 97  BILITOT 2.8*   RADIOLOGY:  US Renal  Result Date: 12/15/2019 CLINICAL DATA:  Acute kidney injury EXAM: RENAL / URINARY TRACT ULTRASOUND COMPLETE COMPARISON:  05/12/2007 FINDINGS: Right Kidney: Renal measurements: 8.9 x 6.1 x 5.3 cm = volume: 151 mL. Cortex is slightly echogenic. No hydronephrosis or mass Left Kidney: Renal measurements: 10.5 x 5.4 x 6.2 cm = volume: 185 mL. Cortex is slightly echogenic. No hydronephrosis or mass. Bladder: Poorly evaluated.  Urinary bladder is empty. Other: Small amount of abdominal ascites.  Incidental note made of right pleural effusion IMPRESSION: 1. Slightly echogenic kidneys consistent with medical renal disease. No hydronephrosis 2. Trace ascites and right pleural effusion Electronically Signed   By: Jasmine Pang M.D.   On: 12/15/2019 16:29   DG Chest Portable 1 View  Result Date: 12/15/2019 CLINICAL DATA:  Lower extremity swelling, weakness EXAM: PORTABLE CHEST 1 VIEW COMPARISON:  06/07/2019 FINDINGS: Mild cardiomegaly. Unchanged small, chronic right pleural effusion  and/or pleural thickening. No acute appearing airspace opacity. The visualized skeletal structures are unremarkable. IMPRESSION: Stable small, chronic right pleural effusion and/or pleural thickening. No acute appearing airspace opacity. Electronically Signed   By: Lauralyn Primes M.D.   On: 12/15/2019 16:36   ASSESSMENT AND PLAN:   69 year old male with known history of chronic hepatitis C, hypertension is being admitted for acute renal failure and severe hypokalemia  *Acute renal failure - prerenal/ATN from medications including HCTZ, meloxicam, tramadol versus hepatorenal syndrome from chronic hep C -All medications and aggressive hydration -Avoid nephrotoxic medications -Renal ultrasound shows medical renal disease.  No hydronephrosis -Renal function improving with hydration, creatinine at 3.75  *Severe hypokalemia -Pharmacy managing this  *Pancytopenia -stable -From chronic hepatitis C  *Chronic hep C and liver disease Followed at [facility] liver center     DVT prophylaxis: Heparin Family Communication:  NO "discussed with patient" Disposition Plan: Home Barriers to DC -none, waiting for renal function and potassium improvement  All the records are reviewed and case discussed with Care Management/Social Worker. Management plans discussed with the patient, nursing and they are in agreement.  CODE STATUS: Full Code  TOTAL TIME TAKING CARE OF THIS PATIENT: 35 minutes.   More than 50% of the time was spent in counseling/coordination of care: YES  POSSIBLE D/C IN 1-2 DAYS, DEPENDING ON CLINICAL CONDITION.   Delfino Lovett M.D on 12/16/2019 at 2:31 PM  [facility]   CC: Primary care physician; Center, [facility]  Note: This dictation was prepared with Dragon dictation along with smaller phrase technology. Any transcriptional errors that result from this process are unintentional.

## 2019-12-17 LAB — BASIC METABOLIC PANEL
Anion gap: 10 (ref 5–15)
BUN: 19 mg/dL (ref 8–23)
CO2: 22 mmol/L (ref 22–32)
Calcium: 7.5 mg/dL — ABNORMAL LOW (ref 8.9–10.3)
Chloride: 105 mmol/L (ref 98–111)
Creatinine, Ser: 3.18 mg/dL — ABNORMAL HIGH (ref 0.61–1.24)
GFR calc Af Amer: 22 mL/min — ABNORMAL LOW (ref >60)
GFR calc non Af Amer: 19 mL/min — ABNORMAL LOW (ref >60)
Glucose, Bld: 92 mg/dL (ref 70–99)
Potassium: 2.9 mmol/L — ABNORMAL LOW (ref 3.5–5.1)
Sodium: 137 mmol/L (ref 135–145)

## 2019-12-17 LAB — CBC
HCT: 21.3 % — ABNORMAL LOW (ref 39.0–52.0)
Hemoglobin: 7.4 g/dL — ABNORMAL LOW (ref 13.0–17.0)
MCH: 29 pg (ref 26.0–34.0)
MCHC: 34.7 g/dL (ref 30.0–36.0)
MCV: 83.5 fL (ref 80.0–100.0)
Platelets: 97 10*3/uL — ABNORMAL LOW (ref 150–400)
RBC: 2.55 MIL/uL — ABNORMAL LOW (ref 4.22–5.81)
RDW: 17.1 % — ABNORMAL HIGH (ref 11.5–15.5)
WBC: 10 10*3/uL (ref 4.0–10.5)
nRBC: 0 % (ref 0.0–0.2)

## 2019-12-17 LAB — GLUCOSE, CAPILLARY
Glucose-Capillary: 72 mg/dL (ref 70–99)
Glucose-Capillary: 101 mg/dL — ABNORMAL HIGH (ref 70–99)

## 2019-12-17 LAB — MAGNESIUM: Magnesium: 1.8 mg/dL (ref 1.7–2.4)

## 2019-12-17 MED ORDER — POTASSIUM CHLORIDE 10 MEQ/100ML IV SOLN
10.0000 meq | INTRAVENOUS | Status: AC
  Administered 2019-12-17 (×4): 10 meq via INTRAVENOUS
  Filled 2019-12-17 (×3): qty 100

## 2019-12-17 MED ORDER — POTASSIUM CHLORIDE CRYS ER 20 MEQ PO TBCR
40.0000 meq | EXTENDED_RELEASE_TABLET | Freq: Once | ORAL | Status: AC
  Administered 2019-12-17: 40 meq via ORAL
  Filled 2019-12-17: qty 2

## 2019-12-17 NOTE — Progress Notes (Signed)
[place] at [place] NAME: John Holden    MR#:  [id number]  DATE OF BIRTH:  [birth year]  SUBJECTIVE:  CHIEF COMPLAINT:   Chief Complaint  Patient presents with  . abnormal lab  Feels tired. potassium 2.9, hemoglobin 7.4.  Hypotensive REVIEW OF SYSTEMS:  Review of Systems  Constitutional: Positive for malaise/fatigue. Negative for diaphoresis, fever and weight loss.  HENT: Negative for ear discharge, ear pain, hearing loss, nosebleeds, sore throat and tinnitus.   Eyes: Negative for blurred vision and pain.  Respiratory: Negative for cough, hemoptysis, shortness of breath and wheezing.   Cardiovascular: Negative for chest pain, palpitations, orthopnea and leg swelling.  Gastrointestinal: Negative for abdominal pain, blood in stool, constipation, diarrhea, heartburn, nausea and vomiting.  Genitourinary: Negative for dysuria, frequency and urgency.  Musculoskeletal: Negative for back pain and myalgias.  Skin: Negative for itching and rash.  Neurological: Negative for dizziness, tingling, tremors, focal weakness, seizures, weakness and headaches.  Psychiatric/Behavioral: Negative for depression. The patient is not nervous/anxious.    DRUG ALLERGIES:  No Known Allergies VITALS:  Blood pressure (!) 93/53, pulse 62, temperature 99 F (37.2 C), temperature source Oral, resp. rate 15, height 5\' 7"  (1.702 m), weight 70.8 kg, SpO2 97 %. PHYSICAL EXAMINATION:  Physical Exam HENT:     Head: Normocephalic and atraumatic.  Eyes:     Conjunctiva/sclera: Conjunctivae normal.     Pupils: Pupils are equal, round, and reactive to light.  Neck:     Thyroid: No thyromegaly.     Trachea: No tracheal deviation.  Cardiovascular:     Rate and Rhythm: Normal rate and regular rhythm.     Heart sounds: Normal heart sounds.  Pulmonary:     Effort: Pulmonary effort is normal. No respiratory distress.     Breath sounds: Normal breath sounds. No wheezing.  Chest:   Chest wall: No tenderness.  Abdominal:     General: Bowel sounds are normal. There is no distension.     Palpations: Abdomen is soft.     Tenderness: There is no abdominal tenderness.  Musculoskeletal:        General: Normal range of motion.     Cervical back: Normal range of motion and neck supple.  Skin:    General: Skin is warm and dry.     Findings: No rash.  Neurological:     Mental Status: He is alert and oriented to person, place, and time.     Cranial Nerves: No cranial nerve deficit.    LABORATORY PANEL:  Male CBC Recent Labs  Lab 12/17/19 0513  WBC 10.0  HGB 7.4*  HCT 21.3*  PLT 97*   ------------------------------------------------------------------------------------------------------------------ Chemistries  Recent Labs  Lab 12/16/19 0426 12/16/19 2004 12/17/19 0513  NA 135  --  137  K 2.4*   < > 2.9*  CL 102  --  105  CO2 21*  --  22  GLUCOSE 135*  --  92  BUN 21  --  19  CREATININE 3.75*  --  3.18*  CALCIUM 6.9*  --  7.5*  MG 1.8  --  1.8  AST 168*  --   --   ALT 51*  --   --   ALKPHOS 97  --   --   BILITOT 2.8*  --   --    < > = values in this interval not displayed.   RADIOLOGY:  No results found. ASSESSMENT AND PLAN:  69 year old male with known history of chronic hepatitis C, hypertension is being admitted for acute renal failure and severe hypokalemia  *Acute renal failure - prerenal/ATN from medications including HCTZ, meloxicam, tramadol versus hepatorenal syndrome from chronic hep C -Improving with aggressive hydration -Avoid nephrotoxic medications -Renal ultrasound shows medical renal disease.  No hydronephrosis -Renal function improving with hydration, creatinine at 3.75->3.18  *Severe hypokalemia -Pharmacy managing this potassium 2.9  *Pancytopenia -stable -From chronic hepatitis C Hemoglobin 7.4 and platelets 97.  No obvious bleeding, if hemoglobin drops less than 7 will need transfusion  *Chronic hep C and liver  disease Followed at [facility] liver center     DVT prophylaxis: Heparin Family Communication:  NO "discussed with patient" Disposition Plan: Home Barriers to DC -none, waiting for renal function and potassium improvement  All the records are reviewed and case discussed with Care Management/Social Worker. Management plans discussed with the patient, nursing and they are in agreement.  CODE STATUS: Full Code  TOTAL TIME TAKING CARE OF THIS PATIENT: 35 minutes.   More than 50% of the time was spent in counseling/coordination of care: YES  POSSIBLE D/C IN 1-2 DAYS, DEPENDING ON CLINICAL CONDITION.   Delfino Lovett M.D on 12/17/2019 at 3:15 PM  [facility]   CC: Primary care physician; Center, [facility]  Note: This dictation was prepared with Dragon dictation along with smaller phrase technology. Any transcriptional errors that result from this process are unintentional.

## 2019-12-18 DIAGNOSIS — N1832 Chronic kidney disease, stage 3b: Secondary | ICD-10-CM

## 2019-12-18 DIAGNOSIS — D631 Anemia in chronic kidney disease: Secondary | ICD-10-CM

## 2019-12-18 LAB — BASIC METABOLIC PANEL
Anion gap: 6 (ref 5–15)
BUN: 20 mg/dL (ref 8–23)
CO2: 20 mmol/L — ABNORMAL LOW (ref 22–32)
Calcium: 7.7 mg/dL — ABNORMAL LOW (ref 8.9–10.3)
Chloride: 112 mmol/L — ABNORMAL HIGH (ref 98–111)
Creatinine, Ser: 2.96 mg/dL — ABNORMAL HIGH (ref 0.61–1.24)
GFR calc Af Amer: 24 mL/min — ABNORMAL LOW (ref >60)
GFR calc non Af Amer: 21 mL/min — ABNORMAL LOW (ref >60)
Glucose, Bld: 128 mg/dL — ABNORMAL HIGH (ref 70–99)
Potassium: 2.9 mmol/L — ABNORMAL LOW (ref 3.5–5.1)
Sodium: 138 mmol/L (ref 135–145)

## 2019-12-18 LAB — CBC
HCT: 21.5 % — ABNORMAL LOW (ref 39.0–52.0)
Hemoglobin: 7.3 g/dL — ABNORMAL LOW (ref 13.0–17.0)
MCH: 29 pg (ref 26.0–34.0)
MCHC: 34 g/dL (ref 30.0–36.0)
MCV: 85.3 fL (ref 80.0–100.0)
Platelets: 86 10*3/uL — ABNORMAL LOW (ref 150–400)
RBC: 2.52 MIL/uL — ABNORMAL LOW (ref 4.22–5.81)
RDW: 17.2 % — ABNORMAL HIGH (ref 11.5–15.5)
WBC: 7.5 10*3/uL (ref 4.0–10.5)
nRBC: 0 % (ref 0.0–0.2)

## 2019-12-18 LAB — GLUCOSE, CAPILLARY: Glucose-Capillary: 94 mg/dL (ref 70–99)

## 2019-12-18 LAB — POTASSIUM: Potassium: 3.8 mmol/L (ref 3.5–5.1)

## 2019-12-18 MED ORDER — POTASSIUM CHLORIDE IN NACL 40-0.9 MEQ/L-% IV SOLN
INTRAVENOUS | Status: DC
  Administered 2019-12-18 (×2): 100 mL/h via INTRAVENOUS
  Filled 2019-12-18 (×3): qty 1000

## 2019-12-18 MED ORDER — POTASSIUM CHLORIDE CRYS ER 20 MEQ PO TBCR
40.0000 meq | EXTENDED_RELEASE_TABLET | ORAL | Status: AC
  Administered 2019-12-18: 40 meq via ORAL
  Filled 2019-12-18: qty 2

## 2019-12-18 MED ORDER — POTASSIUM CHLORIDE CRYS ER 20 MEQ PO TBCR
40.0000 meq | EXTENDED_RELEASE_TABLET | Freq: Once | ORAL | Status: AC
  Administered 2019-12-18: 40 meq via ORAL
  Filled 2019-12-18: qty 2

## 2019-12-18 MED ORDER — MAGNESIUM SULFATE 2 GM/50ML IV SOLN
2.0000 g | Freq: Once | INTRAVENOUS | Status: AC
  Administered 2019-12-18: 2 g via INTRAVENOUS
  Filled 2019-12-18: qty 50

## 2019-12-18 NOTE — Care Management Important Message (Signed)
Important Message  Patient Details  Name: John Holden MRN: [id number] Date of Birth: 1950/12/03   Medicare Important Message Given:  Yes     [name] 12/18/2019, 11:51 AM

## 2019-12-18 NOTE — Consult Note (Signed)
PHARMACY CONSULT NOTE  Pharmacy Consult for Electrolyte Monitoring and Replacement   Recent Labs: Potassium (mmol/L)  Date Value  12/18/2019 3.8  04/21/2013 4.6   Magnesium (mg/dL)  Date Value  27/08/9289 1.8   Calcium (mg/dL)  Date Value  90/30/1499 7.7 (L)   Calcium, Total (mg/dL)  Date Value  69/24/9324 9.7   Albumin (g/dL)  Date Value  19/91/4445 2.0 (L)  04/21/2013 3.7   Phosphorus (mg/dL)  Date Value  84/83/5075 3.5   Sodium (mmol/L)  Date Value  12/18/2019 138  04/21/2013 131 (L)     Assessment: Pharmacy has been consulted to monitor and replace electrolytes in 68yo patient with acute renal failure and hypokalemia. He received a total of 100 mEq IV KCl and 40 mEq oral KCl over the previous 24 hours. He is currently receiving 0.9% NaCl at 100 mL/hr  Goal of Therapy:  Electrolytes WNL  Plan:   Add 40 mEq KCl/L to 0.9% NaCl and continue at 100 mL/hr  Potasium at 3.8 still on IV fluid w/ KCL - no additional replacement needed.   Ronnald Ramp ,PharmD Clinical Pharmacist 12/18/2019 7:33 PM

## 2019-12-18 NOTE — Consult Note (Signed)
PHARMACY CONSULT NOTE  Pharmacy Consult for Electrolyte Monitoring and Replacement   Recent Labs: Potassium (mmol/L)  Date Value  12/18/2019 2.9 (L)  04/21/2013 4.6   Magnesium (mg/dL)  Date Value  29/84/7308 1.8   Calcium (mg/dL)  Date Value  56/94/3700 7.7 (L)   Calcium, Total (mg/dL)  Date Value  52/59/1028 9.7   Albumin (g/dL)  Date Value  90/22/8406 2.0 (L)  04/21/2013 3.7   Phosphorus (mg/dL)  Date Value  98/61/4830 3.5   Sodium (mmol/L)  Date Value  12/18/2019 138  04/21/2013 131 (L)     Assessment: Pharmacy has been consulted to monitor and replace electrolytes in 68yo patient with acute renal failure and hypokalemia. He received a total of 100 mEq IV KCl and 40 mEq oral KCl over the previous 24 hours. He is currently receiving 0.9% NaCl at 100 mL/hr  Goal of Therapy:  Electrolytes WNL  Plan:   Add 40 mEq KCl/L to 0.9% NaCl and continue at 100 mL/hr  40 mEq oral KCl x 2  Re-check potassium at 1800  Lowella Bandy ,PharmD Clinical Pharmacist 12/18/2019 7:22 AM

## 2019-12-18 NOTE — Progress Notes (Signed)
1        [place] at [facility]   PATIENT NAME: John Holden    MR#:  [id number]  DATE OF BIRTH:  08/30/1951  SUBJECTIVE:  CHIEF COMPLAINT:   Chief Complaint  Patient presents with  . abnormal lab  no new c/o potassium 2.9, hemoglobin 7.3.  BP better REVIEW OF SYSTEMS:  Review of Systems  Constitutional: Positive for malaise/fatigue. Negative for diaphoresis, fever and weight loss.  HENT: Negative for ear discharge, ear pain, hearing loss, nosebleeds, sore throat and tinnitus.   Eyes: Negative for blurred vision and pain.  Respiratory: Negative for cough, hemoptysis, shortness of breath and wheezing.   Cardiovascular: Negative for chest pain, palpitations, orthopnea and leg swelling.  Gastrointestinal: Negative for abdominal pain, blood in stool, constipation, diarrhea, heartburn, nausea and vomiting.  Genitourinary: Negative for dysuria, frequency and urgency.  Musculoskeletal: Negative for back pain and myalgias.  Skin: Negative for itching and rash.  Neurological: Negative for dizziness, tingling, tremors, focal weakness, seizures, weakness and headaches.  Psychiatric/Behavioral: Negative for depression. The patient is not nervous/anxious.    DRUG ALLERGIES:  No Known Allergies VITALS:  Blood pressure 111/66, pulse 74, temperature 98 F (36.7 C), resp. rate 16, height 5\' 7"  (1.702 m), weight 70.8 kg, SpO2 100 %. PHYSICAL EXAMINATION:  Physical Exam HENT:     Head: Normocephalic and atraumatic.  Eyes:     Conjunctiva/sclera: Conjunctivae normal.     Pupils: Pupils are equal, round, and reactive to light.  Neck:     Thyroid: No thyromegaly.     Trachea: No tracheal deviation.  Cardiovascular:     Rate and Rhythm: Normal rate and regular rhythm.     Heart sounds: Normal heart sounds.  Pulmonary:     Effort: Pulmonary effort is normal. No respiratory distress.     Breath sounds: Normal breath sounds. No wheezing.  Chest:     Chest wall: No tenderness.    Abdominal:     General: Bowel sounds are normal. There is no distension.     Palpations: Abdomen is soft.     Tenderness: There is no abdominal tenderness.  Musculoskeletal:        General: Normal range of motion.     Cervical back: Normal range of motion and neck supple.  Skin:    General: Skin is warm and dry.     Findings: No rash.  Neurological:     Mental Status: He is alert and oriented to person, place, and time.     Cranial Nerves: No cranial nerve deficit.    LABORATORY PANEL:  Male CBC Recent Labs  Lab 12/18/19 0607  WBC 7.5  HGB 7.3*  HCT 21.5*  PLT 86*   ------------------------------------------------------------------------------------------------------------------ Chemistries  Recent Labs  Lab 12/16/19 0426 12/16/19 2004 12/17/19 0513 12/17/19 0513 12/18/19 0607  NA 135  --  137   < > 138  K 2.4*   < > 2.9*   < > 2.9*  CL 102  --  105   < > 112*  CO2 21*  --  22   < > 20*  GLUCOSE 135*  --  92   < > 128*  BUN 21  --  19   < > 20  CREATININE 3.75*  --  3.18*   < > 2.96*  CALCIUM 6.9*  --  7.5*   < > 7.7*  MG 1.8  --  1.8  --   --   AST 168*  --   --   --   --  ALT 51*  --   --   --   --   ALKPHOS 97  --   --   --   --   BILITOT 2.8*  --   --   --   --    < > = values in this interval not displayed.   RADIOLOGY:  No results found. ASSESSMENT AND PLAN:   69 year old male with known history of chronic hepatitis C, hypertension is being admitted for acute renal failure and severe hypokalemia  *Acute renal failure - prerenal/ATN from medications including HCTZ, meloxicam, tramadol versus hepatorenal syndrome from chronic hep C -Improving with aggressive hydration -Avoid nephrotoxic medications -Renal ultrasound shows medical renal disease.  No hydronephrosis -Renal function improving with hydration, creatinine at 3.75->3.18->2.96  *Severe hypokalemia -Pharmacy managing this potassium 2.9  *Anemia and Thrombocytopenia -From chronic  hepatitis C Hemoglobin 7.4->7.3 and platelets 97->86.  No obvious bleeding, if hemoglobin drops less than 7 will need transfusion  *Chronic hep C and liver disease Followed at [facility] liver center     DVT prophylaxis: stop heparin as platelets dropping. Patient ambulating so no need  Family Communication:  NO "discussed with patient" Disposition Plan: Home Barriers to DC -none, waiting for renal function and potassium improvement  All the records are reviewed and case discussed with Care Management/Social Worker. Management plans discussed with the patient, nursing and they are in agreement.  CODE STATUS: Full Code  TOTAL TIME TAKING CARE OF THIS PATIENT: 35 minutes.   More than 50% of the time was spent in counseling/coordination of care: YES  POSSIBLE D/C IN 1 DAYS, DEPENDING ON CLINICAL CONDITION.   John Holden M.D on 12/18/2019 at 4:32 PM  [facility]   CC: Primary care physician; Center, [facility]  Note: This dictation was prepared with Dragon dictation along with smaller phrase technology. Any transcriptional errors that result from this process are unintentional.

## 2019-12-19 DIAGNOSIS — E876 Hypokalemia: Secondary | ICD-10-CM

## 2019-12-19 LAB — CBC
HCT: 22.8 % — ABNORMAL LOW (ref 39.0–52.0)
Hemoglobin: 7.7 g/dL — ABNORMAL LOW (ref 13.0–17.0)
MCH: 29.2 pg (ref 26.0–34.0)
MCHC: 33.8 g/dL (ref 30.0–36.0)
MCV: 86.4 fL (ref 80.0–100.0)
Platelets: 103 10*3/uL — ABNORMAL LOW (ref 150–400)
RBC: 2.64 MIL/uL — ABNORMAL LOW (ref 4.22–5.81)
RDW: 17.4 % — ABNORMAL HIGH (ref 11.5–15.5)
WBC: 9.3 10*3/uL (ref 4.0–10.5)
nRBC: 0 % (ref 0.0–0.2)

## 2019-12-19 LAB — RENAL FUNCTION PANEL
Albumin: 1.7 g/dL — ABNORMAL LOW (ref 3.5–5.0)
Anion gap: 4 — ABNORMAL LOW (ref 5–15)
BUN: 17 mg/dL (ref 8–23)
CO2: 21 mmol/L — ABNORMAL LOW (ref 22–32)
Calcium: 8.1 mg/dL — ABNORMAL LOW (ref 8.9–10.3)
Chloride: 114 mmol/L — ABNORMAL HIGH (ref 98–111)
Creatinine, Ser: 2.31 mg/dL — ABNORMAL HIGH (ref 0.61–1.24)
GFR calc Af Amer: 32 mL/min — ABNORMAL LOW (ref >60)
GFR calc non Af Amer: 28 mL/min — ABNORMAL LOW (ref >60)
Glucose, Bld: 95 mg/dL (ref 70–99)
Phosphorus: 2.8 mg/dL (ref 2.5–4.6)
Potassium: 3.7 mmol/L (ref 3.5–5.1)
Sodium: 139 mmol/L (ref 135–145)

## 2019-12-19 LAB — MAGNESIUM: Magnesium: 1.8 mg/dL (ref 1.7–2.4)

## 2019-12-19 LAB — GLUCOSE, CAPILLARY: Glucose-Capillary: 89 mg/dL (ref 70–99)

## 2019-12-19 NOTE — Evaluation (Signed)
Physical Therapy Evaluation Patient Details Name: John Holden MRN: [id number] DOB: 25-Feb-1951 Today's Date: 12/19/2019   History of Present Illness  presented to ER from outpatient clinic due to abnormal labs; admitted for management of acute renal failure, hypokalemia (now corrected)  Clinical Impression  Upon evaluation, patient alert and oriented; follows commands, eager for OOB efforts. Bilat UE/LE strength and ROM Grossly symmetrical and WFL; no focal weakness appreciated.  Able to complete bed mobility with mod indep; sit/stand, basic transfers and gait (200') with RW, cga/close sup.  Demonstrates forward flexed posture with RW arms-length anterior to patient, broad BOS, somewhat choppy steps, but no overt buckling or LOB; limited adherence/integration of therapist cuing.  Decreased cadence and overall gait speed noted (10' walk time, 10 seconds), but appropriate for household ambulation.  Do recommend continued use of RW at this time; patient voiced understanding and does report having RW at home. Would benefit from skilled PT to address above deficits and promote optimal return to PLOF.; Recommend transition to HHPT upon discharge from acute hospitalization.     Follow Up Recommendations Home health PT    Equipment Recommendations  Rolling walker with 5" wheels    Recommendations for Other Services       Precautions / Restrictions Precautions Precautions: Fall Restrictions Weight Bearing Restrictions: No      Mobility  Bed Mobility Overal bed mobility: Modified Independent                Transfers Overall transfer level: Needs assistance Equipment used: Rolling walker (2 wheeled) Transfers: Sit to/from Stand Sit to Stand: Min guard;Supervision         General transfer comment: tends to pull on RW despite cuing from therapist; broad BOS  Ambulation/Gait Ambulation/Gait assistance: Supervision Gait Distance (Feet): 200 Feet Assistive device: Rolling  walker (2 wheeled)   Gait velocity: 10' walk time, 10 seconds Gait velocity interpretation: <1.31 ft/sec, indicative of household ambulator General Gait Details: forward flexed posture with RW arms-length anterior to patient, broad BOS, somewhat choppy steps, but no overt buckling or LOB; limited adherence/integration of therapist cuing  Stairs            Wheelchair Mobility    Modified Rankin (Stroke Patients Only)       Balance Overall balance assessment: Needs assistance Sitting-balance support: No upper extremity supported;Feet supported       Standing balance support: Bilateral upper extremity supported Standing balance-Leahy Scale: Fair                               Pertinent Vitals/Pain Pain Assessment: No/denies pain    Home Living Family/patient expects to be discharged to:: Private residence Living Arrangements: Spouse/significant other Available Help at Discharge: Family Type of Home: House Home Access: Stairs to enter Entrance Stairs-Rails: None Entrance Stairs-Number of Steps: 2-3 Home Layout: One level Home Equipment: Walker - 2 wheels      Prior Function Level of Independence: Independent         Comments: Indep with ADLs, household and community mobilization without assist device; denies fall history.     Hand Dominance   Dominant Hand: Right    Extremity/Trunk Assessment   Upper Extremity Assessment Upper Extremity Assessment: Overall WFL for tasks assessed(grossly at least 4+/5 throughout)    Lower Extremity Assessment Lower Extremity Assessment: Overall WFL for tasks assessed(grossly at least 4+/5 throughout)       Communication   Communication: No difficulties  Cognition Arousal/Alertness: Awake/alert Behavior During Therapy: WFL for tasks assessed/performed Overall Cognitive Status: Within Functional Limits for tasks assessed                                        General Comments       Exercises     Assessment/Plan    PT Assessment Patient needs continued PT services  PT Problem List Decreased activity tolerance;Decreased balance;Decreased mobility;Cardiopulmonary status limiting activity       PT Treatment Interventions DME instruction;Gait training;Stair training;Functional mobility training;Therapeutic activities;Therapeutic exercise;Balance training;Patient/family education    PT Goals (Current goals can be found in the Care Plan section)  Acute Rehab PT Goals Patient Stated Goal: to return home PT Goal Formulation: With patient Time For Goal Achievement: 01/02/20 Potential to Achieve Goals: Good    Frequency Min 2X/week   Barriers to discharge        Co-evaluation               AM-PAC PT "6 Clicks" Mobility  Outcome Measure Help needed turning from your back to your side while in a flat bed without using bedrails?: None Help needed moving from lying on your back to sitting on the side of a flat bed without using bedrails?: None Help needed moving to and from a bed to a chair (including a wheelchair)?: None Help needed standing up from a chair using your arms (e.g., wheelchair or bedside chair)?: None Help needed to walk in hospital room?: None Help needed climbing 3-5 steps with a railing? : A Little 6 Click Score: 23    End of Session Equipment Utilized During Treatment: Gait belt Activity Tolerance: Patient tolerated treatment well Patient left: in bed;with call bell/phone within reach;with bed alarm set Nurse Communication: Mobility status PT Visit Diagnosis: Muscle weakness (generalized) (M62.81);Difficulty in walking, not elsewhere classified (R26.2)    Time: 3532-9924 PT Time Calculation (min) (ACUTE ONLY): 16 min   Charges:   PT Evaluation $PT Eval Moderate Complexity: 1 Mod          Calisa Luckenbaugh H. Owens Shark, PT, DPT, NCS 12/19/19, 9:07 AM [phone]

## 2019-12-19 NOTE — TOC Initial Note (Signed)
Transition of Care [facility]) - Initial/Assessment Note    Patient Details  Name: John Holden MRN: [id number] Date of Birth: Nov 14, 1950  Transition of Care [facility]) CM/SW Contact:    Trenton Founds, RN Phone Number: 12/19/2019, 11:49 AM  Clinical Narrative:    RNCM assessed patient at bedside. Patient is currently being prepared for discharge. Patient lives with girlfriend and is attempting to reach her to arrange transportation home. Explained CM role and discussed that it had been recommended that patient have home health therapy after discharge. Initially patient stated that he would like to get home first and see if it is something he really needs but then he was agreeable to referral being made. Patient did not have preference of which home health agency was contacted. Referral given to [facility] with [facility], [facility], [facility].  Patient denies need for walker at this time. He denies any other medical equipment in the home. He reports that he still drives occasionally and goes to [facility] for both his PCP and prescriptions.                Expected Discharge Plan: Home/Self Care Barriers to Discharge: No Barriers Identified   Patient Goals and CMS Choice Patient states their goals for this hospitalization and ongoing recovery are:: I just want to get home.      Expected Discharge Plan and Services Expected Discharge Plan: Home/Self Care   Discharge Planning Services: CM Consult Post Acute Care Choice: Home Health Living arrangements for the past 2 months: Single Family Home Expected Discharge Date: 12/19/19                         HH Arranged: PT, OT HH Agency: [facility] Date HH Agency Contacted: 12/19/19 Time HH Agency Contacted: 1147 Representative spoke with at [facility] Agency: Barbara Cower  Prior Living Arrangements/Services Living arrangements for the past 2 months: Single Family Home Lives with:: Significant Other Patient language and need for interpreter reviewed::  Yes Do you feel safe going back to the place where you live?: Yes      Need for Family Participation in Patient Care: Yes (Comment) Care giver support system in place?: Yes (comment)   Criminal Activity/Legal Involvement Pertinent to Current Situation/Hospitalization: No - Comment as needed  Activities of Daily Living Home Assistive Devices/Equipment: Walker (specify type) ADL Screening (condition at time of admission) Patient's cognitive ability adequate to safely complete daily activities?: Yes Is the patient deaf or have difficulty hearing?: No Does the patient have difficulty seeing, even when wearing glasses/contacts?: No Does the patient have difficulty concentrating, remembering, or making decisions?: No Patient able to express need for assistance with ADLs?: Yes Does the patient have difficulty dressing or bathing?: No Independently performs ADLs?: Yes (appropriate for developmental age) Does the patient have difficulty walking or climbing stairs?: No Weakness of Legs: Both Weakness of Arms/Hands: None  Permission Sought/Granted                  Emotional Assessment Appearance:: Appears stated age Attitude/Demeanor/Rapport: Engaged Affect (typically observed): Appropriate Orientation: : Oriented to Self, Oriented to Place, Oriented to  Time, Oriented to Situation Alcohol / Substance Use: Not Applicable Psych Involvement: No (comment)  Admission diagnosis:  Hypokalemia [E87.6] Acute renal failure (ARF) (HCC) [N17.9] Acute renal failure, unspecified acute renal failure type (HCC) [N17.9] Patient Active Problem List   Diagnosis Date Noted  . Hypokalemia   . Acute renal failure (ARF) (HCC) 12/15/2019  PCP:  [facility], [place]:   [facility] [address], [place] - [place] [address] Phone: [phone] Fax: [phone]     Social Determinants of Health (SDOH) Interventions    Readmission  Risk Interventions No flowsheet data found.

## 2019-12-19 NOTE — Discharge Instructions (Signed)

## 2019-12-19 NOTE — Plan of Care (Signed)
Patient states ready for discharge.  Assisted with contacting patient's brother who is contacting patient girlfriend to pick him up.  PIV removed with tip intact. Telemetry removed.  Patient discharge instructions and medication changes reviewed and given to patient.  Patient verbalized understanding and retold information.

## 2019-12-20 NOTE — Discharge Summary (Signed)
[place] [facility] [place] NAME: John Holden    MR#:  [id number]  DATE OF BIRTH:  1951-05-13  DATE OF ADMISSION:  12/15/2019   ADMITTING PHYSICIAN: Max Sane, MD  DATE OF DISCHARGE: 12/19/2019 11:57 AM  PRIMARY CARE PHYSICIAN: [facility], [place]   ADMISSION DIAGNOSIS:  Hypokalemia [E87.6] Acute renal failure (ARF) (HCC) [N17.9] Acute renal failure, unspecified acute renal failure type ([place]) [N17.9] DISCHARGE DIAGNOSIS:  Active Problems:   Acute renal failure (ARF) (HCC)   Hypokalemia  SECONDARY DIAGNOSIS:   Past Medical History:  Diagnosis Date  . Hypertension    HOSPITAL COURSE:  69 year old male with known history of chronic hepatitis C, hypertension is being admitted for acute renal failure and severe hypokalemia  *Acute renal failure -prerenal/ATN from medications including HCTZ, meloxicam, tramadol versus hepatorenal syndrome from chronic hep C -Improved with aggressive hydration -Renal ultrasound shows medical renal disease. No hydronephrosis  *Severe hypokalemia repleted and resolved  *Anemia and Thrombocytopenia -From chronic hepatitis C stable  *Chronic hep C and liver disease Followed at [facility] liver center  DISCHARGE CONDITIONS:  stable CONSULTS OBTAINED:   DRUG ALLERGIES:  No Known Allergies DISCHARGE MEDICATIONS:   Allergies as of 12/19/2019   No Known Allergies     Medication List    STOP taking these medications   hydrochlorothiazide 25 MG tablet Commonly known as: HYDRODIURIL   meloxicam 15 MG tablet Commonly known as: MOBIC   traMADol 50 MG tablet Commonly known as: Ultram      DISCHARGE INSTRUCTIONS:   DIET:  Regular diet DISCHARGE CONDITION:  Stable ACTIVITY:  Activity as tolerated OXYGEN:  Home Oxygen: No.  Oxygen Delivery: room air DISCHARGE LOCATION:  home with HHPT  If you experience worsening of your admission symptoms, develop shortness of breath, life threatening  emergency, suicidal or homicidal thoughts you must seek medical attention immediately by calling 911 or calling your MD immediately  if symptoms less severe.  You Must read complete instructions/literature along with all the possible adverse reactions/side effects for all the Medicines you take and that have been prescribed to you. Take any new Medicines after you have completely understood and accpet all the possible adverse reactions/side effects.   Please note  You were cared for by a hospitalist during your hospital stay. If you have any questions about your discharge medications or the care you received while you were in the hospital after you are discharged, you can call the unit and asked to speak with the hospitalist on call if the hospitalist that took care of you is not available. Once you are discharged, your primary care physician will handle any further medical issues. Please note that NO REFILLS for any discharge medications will be authorized once you are discharged, as it is imperative that you return to your primary care physician (or establish a relationship with a primary care physician if you do not have one) for your aftercare needs so that they can reassess your need for medications and monitor your lab values.    On the day of Discharge:  VITAL SIGNS:  Blood pressure (!) 126/53, pulse 64, temperature 98.4 F (36.9 C), temperature source Oral, resp. rate 18, height 5\' 7"  (1.702 m), weight 70.8 kg, SpO2 95 %. PHYSICAL EXAMINATION:  GENERAL:  69 y.o.-year-old patient lying in the bed with no acute distress.  EYES: Pupils equal, round, reactive to light and accommodation. No scleral icterus. Extraocular muscles intact.  HEENT: Head atraumatic, normocephalic. Oropharynx and nasopharynx  clear.  NECK:  Supple, no jugular venous distention. No thyroid enlargement, no tenderness.  LUNGS: Normal breath sounds bilaterally, no wheezing, rales,rhonchi or crepitation. No use of accessory  muscles of respiration.  CARDIOVASCULAR: S1, S2 normal. No murmurs, rubs, or gallops.  ABDOMEN: Soft, non-tender, non-distended. Bowel sounds present. No organomegaly or mass.  EXTREMITIES: No pedal edema, cyanosis, or clubbing.  NEUROLOGIC: Cranial nerves II through XII are intact. Muscle strength 5/5 in all extremities. Sensation intact. Gait not checked.  PSYCHIATRIC: The patient is alert and oriented x 3.  SKIN: No obvious rash, lesion, or ulcer.  DATA REVIEW:   CBC Recent Labs  Lab 12/19/19 0524  WBC 9.3  HGB 7.7*  HCT 22.8*  PLT 103*    Chemistries  Recent Labs  Lab 12/16/19 0426 12/16/19 2004 12/19/19 0524  NA 135   < > 139  K 2.4*   < > 3.7  CL 102   < > 114*  CO2 21*   < > 21*  GLUCOSE 135*   < > 95  BUN 21   < > 17  CREATININE 3.75*   < > 2.31*  CALCIUM 6.9*   < > 8.1*  MG 1.8   < > 1.8  AST 168*  --   --   ALT 51*  --   --   ALKPHOS 97  --   --   BILITOT 2.8*  --   --    < > = values in this interval not displayed.     Outpatient follow-up Follow-up Information    Center, [facility]. Go on 12/26/2019.   Why: 10:40am Contact information: [address] [place] [address] [phone]            Management plans discussed with the patient, family and they are in agreement.  CODE STATUS: Prior   TOTAL TIME TAKING CARE OF THIS PATIENT: 45 minutes.    Delfino Lovett M.D on 12/20/2019 at 7:19 PM  [facility]   CC: Primary care physician; Center, [facility]   Note: This dictation was prepared with Dragon dictation along with smaller phrase technology. Any transcriptional errors that result from this process are unintentional.

## 2019-12-21 ENCOUNTER — Other Ambulatory Visit: Payer: Self-pay

## 2019-12-21 ENCOUNTER — Emergency Department: Payer: Medicare Other

## 2019-12-21 ENCOUNTER — Emergency Department
Admission: EM | Admit: 2019-12-21 | Discharge: 2019-12-22 | Disposition: A | Discharge status: Home / Self Care | Payer: Medicare Other | Attending: Student in an Organized Health Care Education/Training Program | Admitting: Student in an Organized Health Care Education/Training Program

## 2019-12-21 DIAGNOSIS — F17210 Nicotine dependence, cigarettes, uncomplicated: Secondary | ICD-10-CM | POA: Diagnosis not present

## 2019-12-21 DIAGNOSIS — R2243 Localized swelling, mass and lump, lower limb, bilateral: Secondary | ICD-10-CM | POA: Diagnosis present

## 2019-12-21 DIAGNOSIS — I10 Essential (primary) hypertension: Secondary | ICD-10-CM | POA: Insufficient documentation

## 2019-12-21 DIAGNOSIS — R600 Localized edema: Secondary | ICD-10-CM | POA: Diagnosis not present

## 2019-12-21 LAB — BASIC METABOLIC PANEL
Anion gap: 7 (ref 5–15)
BUN: 18 mg/dL (ref 8–23)
CO2: 21 mmol/L — ABNORMAL LOW (ref 22–32)
Calcium: 8.3 mg/dL — ABNORMAL LOW (ref 8.9–10.3)
Chloride: 110 mmol/L (ref 98–111)
Creatinine, Ser: 1.71 mg/dL — ABNORMAL HIGH (ref 0.61–1.24)
GFR calc Af Amer: 46 mL/min — ABNORMAL LOW (ref >60)
GFR calc non Af Amer: 40 mL/min — ABNORMAL LOW (ref >60)
Glucose, Bld: 133 mg/dL — ABNORMAL HIGH (ref 70–99)
Potassium: 3 mmol/L — ABNORMAL LOW (ref 3.5–5.1)
Sodium: 138 mmol/L (ref 135–145)

## 2019-12-21 LAB — HEPATIC FUNCTION PANEL
ALT: 85 U/L — ABNORMAL HIGH (ref 0–44)
AST: 137 U/L — ABNORMAL HIGH (ref 15–41)
Albumin: 2 g/dL — ABNORMAL LOW (ref 3.5–5.0)
Alkaline Phosphatase: 79 U/L (ref 38–126)
Bilirubin, Direct: 1.9 mg/dL — ABNORMAL HIGH (ref 0.0–0.2)
Indirect Bilirubin: 2.4 mg/dL — ABNORMAL HIGH (ref 0.3–0.9)
Total Bilirubin: 4.3 mg/dL — ABNORMAL HIGH (ref 0.3–1.2)
Total Protein: 6.7 g/dL (ref 6.5–8.1)

## 2019-12-21 LAB — CBC
HCT: 22.5 % — ABNORMAL LOW (ref 39.0–52.0)
Hemoglobin: 7.4 g/dL — ABNORMAL LOW (ref 13.0–17.0)
MCH: 29.6 pg (ref 26.0–34.0)
MCHC: 32.9 g/dL (ref 30.0–36.0)
MCV: 90 fL (ref 80.0–100.0)
Platelets: 127 10*3/uL — ABNORMAL LOW (ref 150–400)
RBC: 2.5 MIL/uL — ABNORMAL LOW (ref 4.22–5.81)
RDW: 16.9 % — ABNORMAL HIGH (ref 11.5–15.5)
WBC: 7.2 10*3/uL (ref 4.0–10.5)
nRBC: 0 % (ref 0.0–0.2)

## 2019-12-21 LAB — TROPONIN I (HIGH SENSITIVITY): Troponin I (High Sensitivity): 7 ng/L (ref <18)

## 2019-12-21 LAB — BRAIN NATRIURETIC PEPTIDE: B Natriuretic Peptide: 389 pg/mL — ABNORMAL HIGH (ref 0.0–100.0)

## 2019-12-21 MED ORDER — FUROSEMIDE 10 MG/ML IJ SOLN
40.0000 mg | Freq: Once | INTRAMUSCULAR | Status: DC
  Filled 2019-12-21: qty 4

## 2019-12-21 MED ORDER — SODIUM CHLORIDE 0.9% FLUSH: 3.0000 mL | Freq: Once | INTRAVENOUS | Status: DC
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
## 2019-12-21 NOTE — ED Notes (Signed)
This RN ambulated with pt around the room. Prior to ambulation pt O2 was 100%, throughout ambulation pt O2 was about 98%, and after ambulation dropped down to 94% and quickly increased back to 100%. MD Roxan Hockey made aware.

## 2019-12-21 NOTE — ED Triage Notes (Signed)
Pt comes via POV from home with c/o bilateral leg swelling. Pt denies any pain.  Pt states some SOB when he walks a little bit.  Pt denies any CP

## 2019-12-22 NOTE — ED Provider Notes (Signed)
Venous doppler US negative for DVT. Will dc per Dr. Danella Penton plan. Discussed return precautions and f/u with PCP.   I have personally reviewed the images performed during this visit and I agree with the Radiologist's read.   Interpretation by Radiologist:  DG Chest 2 View  Result Date: 12/21/2019 CLINICAL DATA:  Bilateral leg edema. EXAM: CHEST - 2 VIEW COMPARISON:  December 15, 2018 FINDINGS: Mild, stable areas of atelectasis and/or infiltrate are seen within the bilateral lung bases. There are small, stable bilateral pleural effusions. No pneumothorax is identified. The heart size and mediastinal contours are within normal limits. There is mild calcification of the aortic arch. The visualized skeletal structures are unremarkable. IMPRESSION: Stable small bilateral pleural effusions and bibasilar atelectasis and/or infiltrate. Electronically Signed   By: Aram Candela M.D.   On: 12/21/2019 18:12   US Venous Img Lower Bilateral  Result Date: 12/22/2019 CLINICAL DATA:  Bilateral leg swelling EXAM: BILATERAL LOWER EXTREMITY VENOUS DOPPLER ULTRASOUND TECHNIQUE: Gray-scale sonography with compression, as well as color and duplex ultrasound, were performed to evaluate the deep venous system(s) from the level of the common femoral vein through the popliteal and proximal calf veins. COMPARISON:  None. FINDINGS: VENOUS Normal compressibility of the common femoral, superficial femoral, and popliteal veins, as well as the visualized calf veins. Visualized portions of profunda femoral vein and great saphenous vein unremarkable. No filling defects to suggest DVT on grayscale or color Doppler imaging. Doppler waveforms show normal direction of venous flow, normal respiratory phasicity and response to augmentation. OTHER None. Limitations: none IMPRESSION: No evidence of lower extremity DVT. Electronically Signed   By: Charlett Nose M.D.   On: 12/22/2019 00:56      Nita Sickle, MD 12/22/19 [phone]

## 2020-03-20 ENCOUNTER — Other Ambulatory Visit: Payer: Self-pay | Admitting: Gastroenterology

## 2020-03-20 DIAGNOSIS — K7460 Unspecified cirrhosis of liver: Secondary | ICD-10-CM

## 2020-03-20 DIAGNOSIS — B182 Chronic viral hepatitis C: Secondary | ICD-10-CM

## 2020-03-28 ENCOUNTER — Ambulatory Visit
Admission: RE | Admit: 2020-03-28 | Discharge: 2020-03-28 | Disposition: A | Discharge status: Home / Self Care | Payer: Medicare Other | Source: Ambulatory Visit | Attending: Gastroenterology | Admitting: Gastroenterology

## 2020-03-28 ENCOUNTER — Other Ambulatory Visit: Payer: Self-pay

## 2020-03-28 DIAGNOSIS — B182 Chronic viral hepatitis C: Secondary | ICD-10-CM | POA: Insufficient documentation

## 2020-03-28 DIAGNOSIS — K7460 Unspecified cirrhosis of liver: Secondary | ICD-10-CM | POA: Insufficient documentation

## 2020-09-19 ENCOUNTER — Observation Stay
Admission: EM | Admit: 2020-09-19 | Discharge: 2020-09-20 | Disposition: A | Discharge status: Home / Self Care | Payer: Medicare Other | Attending: Internal Medicine | Admitting: Internal Medicine

## 2020-09-19 ENCOUNTER — Other Ambulatory Visit: Payer: Self-pay

## 2020-09-19 ENCOUNTER — Emergency Department: Payer: Medicare Other

## 2020-09-19 ENCOUNTER — Observation Stay: Payer: Medicare Other

## 2020-09-19 DIAGNOSIS — Z20822 Contact with and (suspected) exposure to covid-19: Secondary | ICD-10-CM | POA: Insufficient documentation

## 2020-09-19 DIAGNOSIS — K8020 Calculus of gallbladder without cholecystitis without obstruction: Secondary | ICD-10-CM

## 2020-09-19 DIAGNOSIS — K7290 Hepatic failure, unspecified without coma: Principal | ICD-10-CM | POA: Insufficient documentation

## 2020-09-19 DIAGNOSIS — Z9181 History of falling: Secondary | ICD-10-CM | POA: Diagnosis not present

## 2020-09-19 DIAGNOSIS — F17210 Nicotine dependence, cigarettes, uncomplicated: Secondary | ICD-10-CM | POA: Diagnosis not present

## 2020-09-19 DIAGNOSIS — Z79899 Other long term (current) drug therapy: Secondary | ICD-10-CM | POA: Diagnosis not present

## 2020-09-19 DIAGNOSIS — K7682 Hepatic encephalopathy: Secondary | ICD-10-CM | POA: Diagnosis present

## 2020-09-19 DIAGNOSIS — I10 Essential (primary) hypertension: Secondary | ICD-10-CM | POA: Diagnosis not present

## 2020-09-19 DIAGNOSIS — E7220 Disorder of urea cycle metabolism, unspecified: Secondary | ICD-10-CM

## 2020-09-19 DIAGNOSIS — G9340 Encephalopathy, unspecified: Secondary | ICD-10-CM

## 2020-09-19 DIAGNOSIS — R410 Disorientation, unspecified: Secondary | ICD-10-CM | POA: Diagnosis present

## 2020-09-19 LAB — CBC WITH DIFFERENTIAL/PLATELET
Abs Immature Granulocytes: 0.02 10*3/uL (ref 0.00–0.07)
Basophils Absolute: 0 10*3/uL (ref 0.0–0.1)
Basophils Relative: 0 %
Eosinophils Absolute: 0.1 10*3/uL (ref 0.0–0.5)
Eosinophils Relative: 2 %
HCT: 22.6 % — ABNORMAL LOW (ref 39.0–52.0)
Hemoglobin: 7.2 g/dL — ABNORMAL LOW (ref 13.0–17.0)
Immature Granulocytes: 0 %
Lymphocytes Relative: 29 %
Lymphs Abs: 1.8 10*3/uL (ref 0.7–4.0)
MCH: 25.7 pg — ABNORMAL LOW (ref 26.0–34.0)
MCHC: 31.9 g/dL (ref 30.0–36.0)
MCV: 80.7 fL (ref 80.0–100.0)
Monocytes Absolute: 0.7 10*3/uL (ref 0.1–1.0)
Monocytes Relative: 11 %
Neutro Abs: 3.6 10*3/uL (ref 1.7–7.7)
Neutrophils Relative %: 58 %
Platelets: 180 10*3/uL (ref 150–400)
RBC: 2.8 MIL/uL — ABNORMAL LOW (ref 4.22–5.81)
RDW: 23.4 % — ABNORMAL HIGH (ref 11.5–15.5)
WBC: 6.3 10*3/uL (ref 4.0–10.5)
nRBC: 0 % (ref 0.0–0.2)

## 2020-09-19 LAB — URINALYSIS, COMPLETE (UACMP) WITH MICROSCOPIC
Bacteria, UA: NONE SEEN
Bilirubin Urine: NEGATIVE
Glucose, UA: NEGATIVE mg/dL
Hgb urine dipstick: NEGATIVE
Ketones, ur: NEGATIVE mg/dL
Leukocytes,Ua: NEGATIVE
Nitrite: NEGATIVE
Protein, ur: NEGATIVE mg/dL
Specific Gravity, Urine: 1.017 (ref 1.005–1.030)
pH: 7 (ref 5.0–8.0)

## 2020-09-19 LAB — AMMONIA: Ammonia: 47 umol/L — ABNORMAL HIGH (ref 9–35)

## 2020-09-19 LAB — COMPREHENSIVE METABOLIC PANEL
ALT: 33 U/L (ref 0–44)
AST: 69 U/L — ABNORMAL HIGH (ref 15–41)
Albumin: 2.5 g/dL — ABNORMAL LOW (ref 3.5–5.0)
Alkaline Phosphatase: 130 U/L — ABNORMAL HIGH (ref 38–126)
Anion gap: 9 (ref 5–15)
BUN: 17 mg/dL (ref 8–23)
CO2: 21 mmol/L — ABNORMAL LOW (ref 22–32)
Calcium: 9 mg/dL (ref 8.9–10.3)
Chloride: 100 mmol/L (ref 98–111)
Creatinine, Ser: 1.18 mg/dL (ref 0.61–1.24)
GFR, Estimated: >60 mL/min (ref >60)
Glucose, Bld: 108 mg/dL — ABNORMAL HIGH (ref 70–99)
Potassium: 4.2 mmol/L (ref 3.5–5.1)
Sodium: 130 mmol/L — ABNORMAL LOW (ref 135–145)
Total Bilirubin: 4 mg/dL — ABNORMAL HIGH (ref 0.3–1.2)
Total Protein: 7.4 g/dL (ref 6.5–8.1)

## 2020-09-19 LAB — RESP PANEL BY RT-PCR (FLU A&B, COVID) ARPGX2
Influenza A by PCR: NEGATIVE
Influenza B by PCR: NEGATIVE
SARS Coronavirus 2 by RT PCR: NEGATIVE

## 2020-09-19 LAB — ETHANOL: Alcohol, Ethyl (B): <10 mg/dL (ref <10)

## 2020-09-19 LAB — LACTIC ACID, PLASMA
Lactic Acid, Venous: 1.4 mmol/L (ref 0.5–1.9)
Lactic Acid, Venous: 2.2 mmol/L (ref 0.5–1.9)

## 2020-09-19 LAB — PROTIME-INR
INR: 1.2 (ref 0.8–1.2)
Prothrombin Time: 15 seconds (ref 11.4–15.2)

## 2020-09-19 MED ORDER — OXYCODONE HCL 5 MG PO TABS: 5.0000 mg | ORAL_TABLET | ORAL | Status: DC | PRN

## 2020-09-19 MED ORDER — IOHEXOL 300 MG/ML  SOLN
100.0000 mL | Freq: Once | INTRAMUSCULAR | Status: AC | PRN
  Administered 2020-09-19: 15:00:00 100 mL via INTRAVENOUS

## 2020-09-19 MED ORDER — FOLIC ACID 1 MG PO TABS
1.0000 mg | ORAL_TABLET | Freq: Every day | ORAL | Status: DC
  Administered 2020-09-19 – 2020-09-20 (×2): 1 mg via ORAL
  Filled 2020-09-19 (×2): qty 1

## 2020-09-19 MED ORDER — ADULT MULTIVITAMIN W/MINERALS CH
1.0000 | ORAL_TABLET | Freq: Every day | ORAL | Status: DC
  Administered 2020-09-19 – 2020-09-20 (×2): 1 via ORAL
  Filled 2020-09-19 (×2): qty 1

## 2020-09-19 MED ORDER — IBUPROFEN 400 MG PO TABS: 400.0000 mg | ORAL_TABLET | Freq: Four times a day (QID) | ORAL | Status: DC | PRN

## 2020-09-19 MED ORDER — SPIRONOLACTONE 25 MG PO TABS
25.0000 mg | ORAL_TABLET | Freq: Every day | ORAL | Status: DC
  Administered 2020-09-19 – 2020-09-20 (×2): 25 mg via ORAL
  Filled 2020-09-19 (×3): qty 1

## 2020-09-19 MED ORDER — ALBUTEROL SULFATE (2.5 MG/3ML) 0.083% IN NEBU: 2.5000 mg | INHALATION_SOLUTION | RESPIRATORY_TRACT | Status: DC | PRN

## 2020-09-19 MED ORDER — PROPRANOLOL HCL 20 MG PO TABS
20.0000 mg | ORAL_TABLET | Freq: Two times a day (BID) | ORAL | Status: DC
  Administered 2020-09-19 – 2020-09-20 (×2): 20 mg via ORAL
  Filled 2020-09-19 (×3): qty 1

## 2020-09-19 MED ORDER — HYDRALAZINE HCL 20 MG/ML IJ SOLN
10.0000 mg | INTRAMUSCULAR | Status: DC | PRN
Start: 2020-09-19 — End: 2020-09-20

## 2020-09-19 MED ORDER — ONDANSETRON HCL 4 MG PO TABS: 4.0000 mg | ORAL_TABLET | Freq: Four times a day (QID) | ORAL | Status: DC | PRN

## 2020-09-19 MED ORDER — LACTULOSE 10 GM/15ML PO SOLN
30.0000 g | Freq: Once | ORAL | Status: AC
  Administered 2020-09-19: 19:00:00 30 g via ORAL
  Filled 2020-09-19: qty 60

## 2020-09-19 MED ORDER — LACTULOSE 10 GM/15ML PO SOLN
20.0000 g | Freq: Two times a day (BID) | ORAL | Status: DC
  Administered 2020-09-19: 22:00:00 20 g via ORAL
  Filled 2020-09-19: qty 30

## 2020-09-19 MED ORDER — TRAZODONE HCL 50 MG PO TABS: 25.0000 mg | ORAL_TABLET | Freq: Every evening | ORAL | Status: DC | PRN

## 2020-09-19 MED ORDER — ENOXAPARIN SODIUM 40 MG/0.4ML ~~LOC~~ SOLN: 40.0000 mg | SUBCUTANEOUS | Status: DC

## 2020-09-19 MED ORDER — ONDANSETRON HCL 4 MG/2ML IJ SOLN: 4.0000 mg | Freq: Four times a day (QID) | INTRAMUSCULAR | Status: DC | PRN

## 2020-09-19 NOTE — ED Notes (Signed)
Entered room to find pt attempting to climb out of bed -- pt oriented to self and reports he is in [place].  Pt reports he needs to use bathroom.  Oriented pt back to bed and assisted pt with use of urinal.  Pt repositioned in bed and while calm and cooperative has allowed this nurse to replace cardiac monitor and pulse ox for continuous monitoring.   Pt also agreeable to take PO meds (see MAR for med administration).
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
## 2020-09-19 NOTE — ED Notes (Signed)
Pt presents to ED with c/o of having AMS per the wife. Pt is able to anwser all of question and follow my commands at this time except for who the president currently is. Pt denies chest pain or SOB to this RN. Pt denies fevers chills. Pt denies N/V/D. Pt denies urinary symptoms.
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
## 2020-09-19 NOTE — ED Notes (Signed)
Pt attempting to get out of bed and subsequently set off bed alarm; ED staff nurses to bedside - pt attempting to exit bed due to loose bowels secondary to Lactulose for elevated ammonia.  Will request rectal tube/flexi[-seal

## 2020-09-19 NOTE — ED Notes (Signed)
Entered room to find pt resting with eyes closed; RR even and unlabored on RA.  NADN.  Per reporting nurse pt refusing to wear continuous cardiac monitor and continuous pulse ox.

## 2020-09-19 NOTE — ED Notes (Signed)
Fall precautions in place - bed in lowest position and locked; pt wearing skid-proof socks, bed alarm in place and fall risk armband placed on pt wrist.

## 2020-09-20 DIAGNOSIS — K7290 Hepatic failure, unspecified without coma: Secondary | ICD-10-CM | POA: Diagnosis not present

## 2020-09-20 LAB — COMPREHENSIVE METABOLIC PANEL
ALT: 31 U/L (ref 0–44)
AST: 54 U/L — ABNORMAL HIGH (ref 15–41)
Albumin: 2.5 g/dL — ABNORMAL LOW (ref 3.5–5.0)
Alkaline Phosphatase: 101 U/L (ref 38–126)
Anion gap: 8 (ref 5–15)
BUN: 16 mg/dL (ref 8–23)
CO2: 22 mmol/L (ref 22–32)
Calcium: 9.4 mg/dL (ref 8.9–10.3)
Chloride: 103 mmol/L (ref 98–111)
Creatinine, Ser: 1.2 mg/dL (ref 0.61–1.24)
GFR, Estimated: >60 mL/min (ref >60)
Glucose, Bld: 109 mg/dL — ABNORMAL HIGH (ref 70–99)
Potassium: 4.4 mmol/L (ref 3.5–5.1)
Sodium: 133 mmol/L — ABNORMAL LOW (ref 135–145)
Total Bilirubin: 3.6 mg/dL — ABNORMAL HIGH (ref 0.3–1.2)
Total Protein: 7.5 g/dL (ref 6.5–8.1)

## 2020-09-20 LAB — PROTIME-INR
INR: 1.4 — ABNORMAL HIGH (ref 0.8–1.2)
Prothrombin Time: 16.3 seconds — ABNORMAL HIGH (ref 11.4–15.2)

## 2020-09-20 LAB — CBC
HCT: 23.2 % — ABNORMAL LOW (ref 39.0–52.0)
Hemoglobin: 7.5 g/dL — ABNORMAL LOW (ref 13.0–17.0)
MCH: 25.9 pg — ABNORMAL LOW (ref 26.0–34.0)
MCHC: 32.3 g/dL (ref 30.0–36.0)
MCV: 80 fL (ref 80.0–100.0)
Platelets: 186 10*3/uL (ref 150–400)
RBC: 2.9 MIL/uL — ABNORMAL LOW (ref 4.22–5.81)
RDW: 23.8 % — ABNORMAL HIGH (ref 11.5–15.5)
WBC: 6.4 10*3/uL (ref 4.0–10.5)
nRBC: 0 % (ref 0.0–0.2)

## 2020-09-20 LAB — AMMONIA: Ammonia: 28 umol/L (ref 9–35)

## 2020-09-20 MED ORDER — LACTULOSE 10 GM/15ML PO SOLN
10.0000 g | Freq: Two times a day (BID) | ORAL | Status: DC
  Administered 2020-09-20: 10:00:00 10 g via ORAL
  Filled 2020-09-20: qty 30

## 2020-09-20 MED ORDER — LACTULOSE 10 GM/15ML PO SOLN: 10.0000 g | Freq: Two times a day (BID) | ORAL | 0 refills | Status: AC

## 2020-09-20 MED ORDER — PROPRANOLOL HCL 20 MG PO TABS: 20.0000 mg | ORAL_TABLET | Freq: Two times a day (BID) | ORAL | 0 refills | Status: AC

## 2020-09-20 MED ORDER — SPIRONOLACTONE 25 MG PO TABS: 25.0000 mg | ORAL_TABLET | Freq: Every day | ORAL | 0 refills | Status: AC

## 2020-09-20 NOTE — TOC Initial Note (Signed)
Transition of Care [facility]) - Initial/Assessment Note    Patient Details  Name: John Holden MRN: [id number] Date of Birth: [birth year]  [facility] CM/SW Contact:    Allayne Butcher, RN Phone Number: 09/20/2020, 9:53 AM  Clinical Narrative:                 Patient placed in observation for metabolic encephalopathy.  RNCM was able to meet with patient at the bedside and he answers questions appropriately.  Patient reports that he is from home where he lives with his girlfriend of 20 years and their 2 adopted children who are 15 and 28 years old.  Patient reports that he is pretty independent but wants to stop falling at home.  He agrees to home health services at discharge.  Patient has a walker and cane but doesn't usually need to use them.  PCP is at [facility] and he uses [facility] for prescriptions.   Patient does not have a preference for home health agency, [place] with [facility] accepted referral for home health services RN, PT, and OT.   Patient is blind in the right eye since 1971 and he does not drive but his girlfriend Roxi provides transportation for him and will pick him up at discharge.   Expected Discharge Plan: Home w Home Health Services Barriers to Discharge: Continued Medical Work up   Patient Goals and CMS Choice Patient states their goals for this hospitalization and ongoing recovery are:: Patient would like to go home today and agrees to home health services CMS Medicare.gov Compare Post Acute Care list provided to:: Patient Choice offered to / list presented to : Patient  Expected Discharge Plan and Services Expected Discharge Plan: Home w Home Health Services   Discharge Planning Services: CM Consult Post Acute Care Choice: Home Health Living arrangements for the past 2 months: Mobile Home                           HH Arranged: RN,PT,OT [facility] Agency: [facility] Date [facility] Agency Contacted: 09/20/20 Time HH Agency Contacted:  [phone] Representative spoke with at [facility] Agency: [place]  Prior Living Arrangements/Services Living arrangements for the past 2 months: Mobile Home Lives with:: Minor Children,Significant Other Patient language and need for interpreter reviewed:: Yes Do you feel safe going back to the place where you live?: Yes      Need for Family Participation in Patient Care: Yes (Comment) Care giver support system in place?: Yes (comment) (girlfriend) Current home services: DME (walker and cane) Criminal Activity/Legal Involvement Pertinent to Current Situation/Hospitalization: No - Comment as needed  Activities of Daily Living      Permission Sought/Granted Permission sought to share information with : Case Manager,Family Supports,Other (comment) Permission granted to share information with : Yes, Verbal Permission Granted  Share Information with NAME: Roxi  Permission granted to share info w AGENCY: [facility]  Permission granted to share info w Relationship: significant other of 20 years     Emotional Assessment Appearance:: Appears stated age Attitude/Demeanor/Rapport: Engaged Affect (typically observed): Accepting Orientation: : Oriented to Self,Oriented to Place,Oriented to  Time,Oriented to Situation Alcohol / Substance Use: Not Applicable Psych Involvement: No (comment)  Admission diagnosis:  Hepatic encephalopathy (HCC) [K72.90] Hyperammonemia (HCC) [E72.20] Encephalopathy [G93.40] Cholelithiasis [K80.20] Patient Active Problem List   Diagnosis Date Noted  . Hepatic encephalopathy (HCC) 09/19/2020  . Hypokalemia   . Acute renal failure (ARF) (HCC) 12/15/2019  PCP:  [facility] Pharmacy:   [facility] [address] [place] - [address] [place] [address] Phone: [phone] Fax: [phone]     Social Determinants of Health (SDOH) Interventions    Readmission Risk Interventions No flowsheet data found.

## 2020-09-20 NOTE — Evaluation (Signed)
Occupational Therapy Evaluation Patient Details Name: John Holden MRN: [id number] DOB: July 01, 1951 Today's Date: 09/20/2020    History of Present Illness Pt is a 69 y/o M who was admitted on 09/19/20 after presenting to ED with significant other who notes pt had several days of frequent falls & confusion. Pt admitted for tx of hepatic encephalopathy. PMH: HTN, Hep C cirrhosis   Clinical Impression   Patient presenting with decreased I in self care, balance, functional mobility/transfer, endurance,cognition,  and safety awareness. Patient reports being independent and living with significant other and 2 young children PTA. Patient currently functioning at min A for functional mobility but needing max multimodal cuing for safety awareness, attention, and increased time to sequence. Pt needing assistance for orientation to correct date and was unaware of rectal tube. Patient will benefit from acute OT to increase overall independence in the areas of ADLs, functional mobility, and safety awareness in order to safely discharge home with family.    Follow Up Recommendations  Home health OT;Supervision/Assistance - 24 hour    Equipment Recommendations  None recommended by OT       Precautions / Restrictions Precautions Precautions: Fall Precaution Comments: rectal tube Restrictions Weight Bearing Restrictions: No      Mobility Bed Mobility Overal bed mobility: Needs Assistance Bed Mobility: Supine to Sit     Supine to sit: Min assist;HOB elevated     General bed mobility comments: multimodal cuing to initiate & complete coming to sit EOB wiht significantly extra time    Transfers Overall transfer level: Needs assistance Equipment used: Rolling walker (2 wheeled) Transfers: Sit to/from Stand Sit to Stand: Min assist;From elevated surface         General transfer comment: RW in front of pt but he reaches for other items and needing cuing for hand placement throughout     Balance Overall balance assessment: Needs assistance Sitting-balance support: Bilateral upper extremity supported;Feet unsupported Sitting balance-Leahy Scale: Fair Sitting balance - Comments: supervision sitting balance EOB   Standing balance support: Bilateral upper extremity supported;During functional activity Standing balance-Leahy Scale: Poor Standing balance comment: BUE support on RW in standing                           ADL either performed or assessed with clinical judgement   ADL Overall ADL's : Needs assistance/impaired     Grooming: Wash/dry hands;Wash/dry face;Standing;Minimal assistance               Lower Body Dressing: Minimal assistance;Sit to/from stand   Toilet Transfer: Minimal assistance;BSC                   Vision Patient Visual Report: No change from baseline              Pertinent Vitals/Pain Pain Assessment: No/denies pain     Hand Dominance Right   Extremity/Trunk Assessment Upper Extremity Assessment Upper Extremity Assessment: Generalized weakness   Lower Extremity Assessment Lower Extremity Assessment: Generalized weakness       Communication Communication Communication: No difficulties   Cognition Arousal/Alertness: Awake/alert Behavior During Therapy: Impulsive Overall Cognitive Status: No family/caregiver present to determine baseline cognitive functioning                                 General Comments: Pt A & O x3. Pt reports date as being "Jan 31st". He does not realize  he has rectal tube and needing max multimodal cuing to follow commands and for safety awareness. Increased time to sequence and initiate tasks   General Comments  Pt appears to have old injury to R eye with pt reporting he cannot see out of it. Pt requires max cuing to turn head to R to visually locate recliner.            Home Living Family/patient expects to be discharged to:: Private residence Living  Arrangements: Spouse/significant other (children - 59 and 71 y/o) Available Help at Discharge: Family Type of Home: Mobile home Home Access: Stairs to enter [employer] of Steps: 2 steps with B rails at backdoor, 4 steps with rails at front door Entrance Stairs-Rails: Right;Left;Can reach both Home Layout: One level     Bathroom Shower/Tub: Chief Strategy Officer: Standard     Home Equipment: Environmental consultant - 2 wheels          Prior Functioning/Environment Level of Independence: Independent        Comments: Pt reports independence with mobility, prior to this event & falls at home        OT Problem List: Decreased strength;Decreased cognition;Decreased activity tolerance;Decreased safety awareness;Impaired balance (sitting and/or standing);Decreased knowledge of use of DME or AE;Decreased knowledge of precautions      OT Treatment/Interventions: Self-care/ADL training;Balance training;Therapeutic exercise;Therapeutic activities;Energy conservation;Cognitive remediation/compensation;DME and/or AE instruction;Patient/family education    OT Goals(Current goals can be found in the care plan section) Acute Rehab OT Goals Patient Stated Goal: to go home OT Goal Formulation: With patient Time For Goal Achievement: 10/04/20 Potential to Achieve Goals: Good ADL Goals Pt Will Perform Grooming: with modified independence;standing Pt Will Transfer to Toilet: with modified independence;ambulating Pt Will Perform Toileting - Clothing Manipulation and hygiene: with modified independence;sit to/from stand  OT Frequency: Min 1X/week   Barriers to D/C:    none known at this time          AM-PAC OT "6 Clicks" Daily Activity     Outcome Measure Help from another person eating meals?: A Little Help from another person taking care of personal grooming?: A Little Help from another person toileting, which includes using toliet, bedpan, or urinal?: A Lot Help from another  person bathing (including washing, rinsing, drying)?: A Lot Help from another person to put on and taking off regular upper body clothing?: A Little Help from another person to put on and taking off regular lower body clothing?: A Little 6 Click Score: 16   End of Session Equipment Utilized During Treatment: Rolling walker Nurse Communication: Mobility status;Precautions  Activity Tolerance: Patient tolerated treatment well Patient left: in bed;with bed alarm set;with call bell/phone within reach  OT Visit Diagnosis: Unsteadiness on feet (R26.81);Muscle weakness (generalized) (M62.81)                Time: 6468-0321 OT Time Calculation (min): 20 min Charges:  OT General Charges $OT Visit: 1 Visit OT Evaluation $OT Eval Low Complexity: 1 Low OT Treatments $Self Care/Home Management : 8-22 mins  Jackquline Denmark, MS, OTR/L , CBIS ascom [phone]  09/20/20, 11:37 AM

## 2020-09-20 NOTE — Care Management Obs Status (Signed)
MEDICARE OBSERVATION STATUS NOTIFICATION   Patient Details  Name: John Holden MRN: [id number] Date of Birth: [birth year]   Medicare Observation Status Notification Given:  Yes    Allayne Butcher, RN 09/20/2020, 9:20 AM

## 2020-09-20 NOTE — ED Notes (Signed)
Pt again attempting to climb out of bed unassisted -- pt insists he needs to have a bowel movement; difficult to orient patient to his rectal tube.

## 2020-09-20 NOTE — Evaluation (Signed)
Physical Therapy Evaluation Patient Details Name: John Holden MRN: [id number] DOB: [birth year] Today's Date: 09/20/2020   History of Present Illness  Pt is a 69 y/o M who was admitted on 09/19/20 after presenting to ED with significant other who notes pt had several days of frequent falls & confusion. Pt admitted for tx of hepatic encephalopathy. PMH: HTN, Hep C cirrhosis  Clinical Impression  Pt reports independence without AD prior to admission. Pt AxO x 3 on this date but with impaired intellectual, emergent, and anticipatory awareness. Pt requires min assist overall for mobility but is significantly limited by impaired cognition & significantly extra time to follow one step commands inconsistently. Anticipate pt can mobilize well with ongoing PT treatment. At this time recommending HHPT & 24 hr supervision/assist at d/c from hospital but if family cannot provide this pt will need to d/c to SNF. Will continue to follow acutely to address deficits noted & increase safety with mobility.     Follow Up Recommendations Supervision for mobility/OOB;Supervision/Assistance - 24 hour;Home health PT    Equipment Recommendations  Rolling walker with 5" wheels    Recommendations for Other Services       Precautions / Restrictions Precautions Precautions: Fall Precaution Comments: rectal tube Restrictions Weight Bearing Restrictions: No      Mobility  Bed Mobility Overal bed mobility: Needs Assistance Bed Mobility: Supine to Sit     Supine to sit: Min assist;HOB elevated     General bed mobility comments: multimodal cuing to initiate & complete coming to sit EOB wiht significantly extra time    Transfers Overall transfer level: Needs assistance   Transfers: Sit to/from Stand Sit to Stand: Min assist;From elevated surface         General transfer comment: poor awareness & inability to follow instructions re: safe hand placement on RW  Ambulation/Gait Ambulation/Gait  assistance: Min assist Gait Distance (Feet): 5 Feet Assistive device: Rolling walker (2 wheeled) Gait Pattern/deviations: Decreased step length - right;Decreased step length - left;Decreased stride length     General Gait Details: Pt with little to no awareness re: safe use of RW & poor ability to follow instructions. Pt requires max cuing & significantly extra time to ambulate a few steps to recliner in room. Pt could potentially ambulate without AD but unable to attempt on this date 2/2 confusion.  Stairs            Wheelchair Mobility    Modified Rankin (Stroke Patients Only)       Balance Overall balance assessment: Mild deficits observed, not formally tested;Needs assistance Sitting-balance support: Bilateral upper extremity supported;Feet unsupported Sitting balance-Leahy Scale: Fair Sitting balance - Comments: supervision sitting balance EOB   Standing balance support: Bilateral upper extremity supported;During functional activity Standing balance-Leahy Scale: Poor Standing balance comment: BUE support on RW in standing                             Pertinent Vitals/Pain Pain Assessment: No/denies pain    Home Living Family/patient expects to be discharged to:: Private residence Living Arrangements: Spouse/significant other (young children) Available Help at Discharge: Family Type of Home: Mobile home Home Access: Stairs to enter Entrance Stairs-Rails: Right;Left;Can reach both Entrance Stairs-Number of Steps: 2 steps with B rails at backdoor, 4 steps with rails at front door Home Layout: One level Home Equipment: None      Prior Function Level of Independence: Independent  Comments: Pt reports independence with mobility, prior to this event & falls at home     Hand Dominance        Extremity/Trunk Assessment   Upper Extremity Assessment Upper Extremity Assessment: Overall WFL for tasks assessed    Lower Extremity  Assessment Lower Extremity Assessment: Generalized weakness       Communication      Cognition Arousal/Alertness: Awake/alert   Overall Cognitive Status: No family/caregiver present to determine baseline cognitive functioning                                 General Comments: Pt AxO x 3 (person, place, situation) but requires MAX multimodal cuing to follow commands inconsistently with extra time      General Comments General comments (skin integrity, edema, etc.): Pt appears to have old injury to R eye with pt reporting he cannot see out of it. Pt requires max cuing to turn head to R to visually locate recliner.    Exercises     Assessment/Plan    PT Assessment Patient needs continued PT services  PT Problem List Decreased strength;Decreased balance;Decreased cognition;Decreased knowledge of precautions;Decreased knowledge of use of DME;Decreased mobility;Decreased activity tolerance;Decreased coordination;Decreased safety awareness       PT Treatment Interventions DME instruction;Functional mobility training;Balance training;Patient/family education;Neuromuscular re-education;Gait training;Therapeutic activities;Stair training;Therapeutic exercise;Cognitive remediation;Manual techniques    PT Goals (Current goals can be found in the Care Plan section)  Acute Rehab PT Goals Patient Stated Goal: to get rectal tube out PT Goal Formulation: With patient Time For Goal Achievement: 10/04/20 Potential to Achieve Goals: Fair    Frequency Min 2X/week   Barriers to discharge Decreased caregiver support unsure if family can provide physical assist at d/c    Co-evaluation               AM-PAC PT "6 Clicks" Mobility  Outcome Measure Help needed turning from your back to your side while in a flat bed without using bedrails?: A Little Help needed moving from lying on your back to sitting on the side of a flat bed without using bedrails?: A Lot Help needed moving  to and from a bed to a chair (including a wheelchair)?: A Little Help needed standing up from a chair using your arms (e.g., wheelchair or bedside chair)?: A Little Help needed to walk in hospital room?: A Little Help needed climbing 3-5 steps with a railing? : A Little 6 Click Score: 17    End of Session Equipment Utilized During Treatment: Gait belt Activity Tolerance: Patient tolerated treatment well Patient left: in chair;with chair alarm set;with call bell/phone within reach Nurse Communication: Mobility status (confusion) PT Visit Diagnosis: Unsteadiness on feet (R26.81);History of falling (Z91.81);Difficulty in walking, not elsewhere classified (R26.2)    Time: 0942-1000 PT Time Calculation (min) (ACUTE ONLY): 18 min   Charges:   PT Evaluation $PT Eval Moderate Complexity: 1 Mod PT Treatments $Therapeutic Activity: 8-22 mins        Aleda Grana, PT, DPT 09/20/20, 10:27 AM   Sandi Mariscal 09/20/2020, 10:25 AM

## 2020-09-20 NOTE — ED Notes (Signed)
Pt now oriented to person, place and month/year but remains forgetful to situation and has to be reoriented frequently to events

## 2020-09-20 NOTE — Discharge Summary (Signed)
Physician Discharge Summary  John Holden YQM:[id number] DOB: 25-Jul-1951 DOA: 09/19/2020  PCP: [facility]  Admit date: 09/19/2020 Discharge date: 09/20/2020  Admitted From: Home Disposition: Home health  Recommendations for Outpatient Follow-up:  1. Follow up with PCP in 1-2 weeks 2. Follow up with a liver specialist for cirrhosis management  Home Health:Yes Equipment/Devices:No Discharge Condition:Stable CODE STATUS: Full Diet recommendation: Low sodium   Brief/Interim Summary: HPI: John Holden is a 69 y.o. male with medical history significant of hypertension and hepatitis C cirrhosis who does not appear to follow-up with physicians who was brought to the emergency department by significant other given concerns of several days of frequent falls and confusion.  Significant other called EMS as the patient is more confused than usual.  Apparently at baseline he is alert and oriented x3.  Unclear time course of events.  On my evaluation patient is resting comfortably in bed.  He is alert and oriented x3 however he has been asked orienting questions for multiple providers.  When asked more detailed questions patient is unable to provide coherent responses.  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  He was last seen by Dr. Norma Holden in June 2021.  At that time he was started on several medications for cirrhosis and instructed to seek treatment for hepatitis C at a tertiary center.  Does not appear this was done and the patient was lost to follow-up.  His adherence to his prescribed medication regimen is greatly in question.  Initial laboratory investigation overall reassuring however does demonstrate mildly elevated ammonia level at 47.  Given altered mentation, frequent falls, elevated ammonia hospitalist was contacted for admission for hepatic encephalopathy  12/24: Patient seen and examined.  Mental status returned to baseline.   Patient with multiple loose BMs.  Had a large rectal tube in place for short period time.  Patient stable for discharge at this time.  Likely etiology of patient's admission and decompensation is hepatic encephalopathy.  I had a lengthy discussion with him regarding need for medication adherence and to follow-up with his primary care physician and seek consultation with a liver specialist as was previously recommended.  I have made multiple attempts to reach this patient significant other but have been unable to do so.    Discharge Diagnoses:  Active Problems:   Hepatic encephalopathy (HCC)  Hepatic encephalopathy Hepatitis C cirrhosis Patient is unable to provide too much history on his diagnosis Has previously been evaluated by Dr. Norma Holden Noted to have cirrhosis secondary to hepatitis C Unclear exactly how extensive his work-up was According to documentation by Dr. Horace Porteous PA John Holden the patient was recommended to treat his hepatitis C at a tertiary center.  Appears he was lost to follow-up As of 03/19/2020 initial consult with Dr. Norma Holden patient was recommended a medication regimen including propranolol, spironolactone, Lasix His adherence to this regimen is greatly in question No previous instances of hepatic encephalopathy on review of records Vital status improved after admission and initiation of lactulose  Discharge recommendations Lactulose 10 g p.o. twice daily, titrate to 2-3 soft bowel movements a day Aldactone 25 mg daily Inderal 20 mg twice daily Ensure follow-up with PCP and liver specialist  Cholelithiasis Multiple gallstones without biliary dilatation noted on CAT scan Presentation inconsistent with acute cholecystitis however patient does have transaminitis - no evidence of cholecystitis on ab [place]  Transaminitis Hyperbilirubinemia Appears mild in presentation is inconsistent with cholangitis.  No biliary ductal dilatation or  ductal stones noted on CT.  Likely  due to cirrhosis  Hypertension Unclear if patient actually takes medications for this Aldactone and Inderal as above  Discharge Instructions  Discharge Instructions    Diet - low sodium heart healthy   Complete by: As directed    Increase activity slowly   Complete by: As directed      Allergies as of 09/20/2020   No Known Allergies     Medication List    STOP taking these medications   hydrochlorothiazide 25 MG tablet Commonly known as: HYDRODIURIL   losartan 25 MG tablet Commonly known as: COZAAR     TAKE these medications   gabapentin 300 MG capsule Commonly known as: NEURONTIN Take 300 mg by mouth 3 (three) times daily.   lactulose 10 GM/15ML solution Commonly known as: CHRONULAC Take 15 mLs (10 g total) by mouth 2 (two) times daily.   propranolol 20 MG tablet Commonly known as: INDERAL Take 1 tablet (20 mg total) by mouth 2 (two) times daily.   spironolactone 100 MG tablet Commonly known as: ALDACTONE Take 100 mg by mouth daily. What changed: Another medication with the same name was added. Make sure you understand how and when to take each.   spironolactone 25 MG tablet Commonly known as: ALDACTONE Take 1 tablet (25 mg total) by mouth daily. Start taking on: September 21, 2020 What changed: You were already taking a medication with the same name, and this prescription was added. Make sure you understand how and when to take each.            Durable Medical Equipment  (From admission, onward)         Start     Ordered   09/20/20 1040  For home use only DME Walker  Once       Question:  Patient needs a walker to treat with the following condition  Answer:  Weakness   09/20/20 1039          Follow-up Information    [facility]. Schedule an appointment as soon as possible for a visit in 1 week(s).   Contact information: [address] [place] [address] [phone]              No Known  Allergies  Consultations: None  Procedures/Studies: DG Chest 2 View  Result Date: 09/19/2020 CLINICAL DATA:  Altered mental status EXAM: CHEST - 2 VIEW COMPARISON:  12/21/2019 FINDINGS: The heart size and mediastinal contours are within normal limits. Atherosclerotic calcification of the aortic knob. Trace right pleural effusion, decreased from prior. No airspace consolidation. No pneumothorax. Chronic left midclavicular fracture. IMPRESSION: Trace right pleural effusion, decreased from prior. Electronically Signed   By: Duanne Guess D.O.   On: 09/19/2020 13:02   CT Head Wo Contrast  Result Date: 09/19/2020 CLINICAL DATA:  Altered mental status EXAM: CT HEAD WITHOUT CONTRAST TECHNIQUE: Contiguous axial images were obtained from the base of the skull through the vertex without intravenous contrast. COMPARISON:  None. FINDINGS: Brain: Mild atrophic changes and chronic white matter ischemic change is noted. No findings to suggest acute hemorrhage, acute infarction or space-occupying mass lesion noted. Vascular: No hyperdense vessel or unexpected calcification. Skull: Normal. Negative for fracture or focal lesion. Sinuses/Orbits: Calcified right globe is noted.  Sinuses are clear. Other: None IMPRESSION: Chronic atrophic and ischemic changes without acute abnormality. Electronically Signed   By: Alcide Clever M.D.   On: 09/19/2020 12:54   CT ABDOMEN PELVIS W  CONTRAST  Result Date: 09/19/2020 CLINICAL DATA:  Abdominal pain.  Altered mental status. EXAM: CT ABDOMEN AND PELVIS WITH CONTRAST TECHNIQUE: Multidetector CT imaging of the abdomen and pelvis was performed using the standard protocol following bolus administration of intravenous contrast. CONTRAST:  OMNIPAQUE IOHEXOL 300 MG/ML  SOLN COMPARISON:  None. FINDINGS: Lower chest: The lung bases are clear. The heart size is normal. Hepatobiliary: The liver is normal. The gallbladder is distended. There are multiple gallstones.There is no  biliary ductal dilation. Pancreas: Normal contours without ductal dilatation. No peripancreatic fluid collection. Spleen: Unremarkable. Adrenals/Urinary Tract: --Adrenal glands: Unremarkable. --Right kidney/ureter: No hydronephrosis or radiopaque kidney stones. --Left kidney/ureter: No hydronephrosis or radiopaque kidney stones. --Urinary bladder: Unremarkable. Stomach/Bowel: --Stomach/Duodenum: No hiatal hernia or other gastric abnormality. Normal duodenal course and caliber. --Small bowel: Unremarkable. --Colon: Unremarkable. --Appendix: Not visualized. No right lower quadrant inflammation or free fluid. Vascular/Lymphatic: Atherosclerotic calcification is present within the non-aneurysmal abdominal aorta, without hemodynamically significant stenosis. --No retroperitoneal lymphadenopathy. --No mesenteric lymphadenopathy. --No pelvic or inguinal lymphadenopathy. Reproductive: Unremarkable Other: No ascites or free air. Multiple metallic foreign bodies about the patient's left hemipelvis. Musculoskeletal. No acute displaced fractures. IMPRESSION: Distended gallbladder with multiple gallstones. If there is concern for acute cholecystitis, follow-up with ultrasound is recommended. Aortic Atherosclerosis (ICD10-I70.0). Electronically Signed   By: Katherine Mantle M.D.   On: 09/19/2020 15:46   US Abdomen Limited RUQ (LIVER/GB)  Result Date: 09/19/2020 CLINICAL DATA:  Cholelithiasis on CT today EXAM: ULTRASOUND ABDOMEN LIMITED RIGHT UPPER QUADRANT COMPARISON:  None. FINDINGS: Gallbladder: Cholelithiasis. No pericholecystic fluid or gallbladder wall thickening. Negative sonographic Murphy sign. Common bile duct: Diameter: 3.7 mm Liver: No focal lesion identified. Increased hepatic parenchymal echogenicity. Portal vein is patent on color Doppler imaging with normal direction of blood flow towards the liver. Other: None. IMPRESSION: Cholelithiasis without sonographic evidence of acute cholecystitis. Increased hepatic  echogenicity as can be seen with hepatic steatosis. Electronically Signed   By: Elige Ko   On: 09/19/2020 16:41    (Echo, Carotid, EGD, Colonoscopy, ERCP)    Subjective: Seen and examined on the day of discharge.  No complaints.  A little unsteady on his feet but mentating clearly  Discharge Exam: Vitals:   09/20/20 0837 09/20/20 1128  BP: (!) 124/50 (!) 116/57  Pulse: 61 66  Resp: 19 18  Temp: 98.4 F (36.9 C) 98.6 F (37 C)  SpO2: (!) 82% 100%   Vitals:   09/20/20 0700 09/20/20 0715 09/20/20 0837 09/20/20 1128  BP: 122/60  (!) 124/50 (!) 116/57  Pulse: (!) 58  61 66  Resp: 15 16 19 18   Temp:   98.4 F (36.9 C) 98.6 F (37 C)  TempSrc:      SpO2: 100%  (!) 82% 100%  Weight:      Height:        General: Pt is alert, awake, not in acute distress Cardiovascular: RRR, S1/S2 +, no rubs, no gallops Respiratory: CTA bilaterally, no wheezing, no rhonchi Abdominal: Soft, NT, ND, bowel sounds + Extremities: no edema, no cyanosis    The results of significant diagnostics from this hospitalization (including imaging, microbiology, ancillary and laboratory) are listed below for reference.     Microbiology: Recent Results (from the past 240 hour(s))  Blood culture (single)     Status: None (Preliminary result)   Collection Time: 09/19/20  2:56 PM   Specimen: BLOOD  Result Value Ref Range Status   Specimen Description BLOOD LEFT ANTECUBITAL  Final   Special  Requests   Final    BOTTLES DRAWN AEROBIC AND ANAEROBIC Blood Culture adequate volume   Culture   Final    NO GROWTH < 24 HOURS Performed at [facility], [address] [place], [place] [address]    Report Status PENDING  Incomplete  Resp Panel by RT-PCR (Flu A&B, Covid) Nasopharyngeal Swab     Status: None   Collection Time: 09/19/20  6:04 PM   Specimen: Nasopharyngeal Swab; Nasopharyngeal(NP) swabs in vial transport medium  Result Value Ref Range Status   SARS Coronavirus 2 by RT PCR NEGATIVE  NEGATIVE Final    Comment: (NOTE) SARS-CoV-2 target nucleic acids are NOT DETECTED.  The SARS-CoV-2 RNA is generally detectable in upper respiratory specimens during the acute phase of infection. The lowest concentration of SARS-CoV-2 viral copies this assay can detect is 138 copies/mL. A negative result does not preclude SARS-Cov-2 infection and should not be used as the sole basis for treatment or other patient management decisions. A negative result may occur with  improper specimen collection/handling, submission of specimen other than nasopharyngeal swab, presence of viral mutation(s) within the areas targeted by this assay, and inadequate number of viral copies(<138 copies/mL). A negative result must be combined with clinical observations, patient history, and epidemiological information. The expected result is Negative.  Fact Sheet for Patients:  BloggerCourse.com  Fact Sheet for Healthcare Providers:  SeriousBroker.it  This test is no t yet approved or cleared by the [place] FDA and  has been authorized for detection and/or diagnosis of SARS-CoV-2 by FDA under an Emergency Use Authorization (EUA). This EUA will remain  in effect (meaning this test can be used) for the duration of the COVID-19 declaration under Section 564(b)(1) of the Act, 21 U.S.C.section 360bbb-3(b)(1), unless the authorization is terminated  or revoked sooner.       Influenza A by PCR NEGATIVE NEGATIVE Final   Influenza B by PCR NEGATIVE NEGATIVE Final    Comment: (NOTE) The Xpert Xpress SARS-CoV-2/FLU/RSV plus assay is intended as an aid in the diagnosis of influenza from Nasopharyngeal swab specimens and should not be used as a sole basis for treatment. Nasal washings and aspirates are unacceptable for Xpert Xpress SARS-CoV-2/FLU/RSV testing.  Fact Sheet for Patients: BloggerCourse.com  Fact Sheet for Healthcare  Providers: SeriousBroker.it  This test is not yet approved or cleared by the [place] FDA and has been authorized for detection and/or diagnosis of SARS-CoV-2 by FDA under an Emergency Use Authorization (EUA). This EUA will remain in effect (meaning this test can be used) for the duration of the COVID-19 declaration under Section 564(b)(1) of the Act, 21 U.S.C. section 360bbb-3(b)(1), unless the authorization is terminated or revoked.  Performed at [facility], [address] [place], [place] [address]      Labs: BNP (last 3 results) Recent Labs    12/21/19 1728  BNP 389.0*   Basic Metabolic Panel: Recent Labs  Lab 09/19/20 1211 09/20/20 0611  NA 130* 133*  K 4.2 4.4  CL 100 103  CO2 21* 22  GLUCOSE 108* 109*  BUN 17 16  CREATININE 1.18 1.20  CALCIUM 9.0 9.4   Liver Function Tests: Recent Labs  Lab 09/19/20 1211 09/20/20 0611  AST 69* 54*  ALT 33 31  ALKPHOS 130* 101  BILITOT 4.0* 3.6*  PROT 7.4 7.5  ALBUMIN 2.5* 2.5*   No results for input(s): LIPASE, AMYLASE in the last 168 hours. Recent Labs  Lab 09/19/20 1315 09/20/20 0611  AMMONIA  47* 28   CBC: Recent Labs  Lab 09/19/20 1211 09/20/20 0611  WBC 6.3 6.4  NEUTROABS 3.6  --   HGB 7.2* 7.5*  HCT 22.6* 23.2*  MCV 80.7 80.0  PLT 180 186   Cardiac Enzymes: No results for input(s): CKTOTAL, CKMB, CKMBINDEX, TROPONINI in the last 168 hours. BNP: Invalid input(s): POCBNP CBG: No results for input(s): GLUCAP in the last 168 hours. D-Dimer No results for input(s): DDIMER in the last 72 hours. Hgb A1c No results for input(s): HGBA1C in the last 72 hours. Lipid Profile No results for input(s): CHOL, HDL, LDLCALC, TRIG, CHOLHDL, LDLDIRECT in the last 72 hours. Thyroid function studies No results for input(s): TSH, T4TOTAL, T3FREE, THYROIDAB in the last 72 hours.  Invalid input(s): FREET3 Anemia work up No results for input(s): VITAMINB12, FOLATE,  FERRITIN, TIBC, IRON, RETICCTPCT in the last 72 hours. Urinalysis    Component Value Date/Time   COLORURINE YELLOW (A) 09/19/2020 1225   APPEARANCEUR CLEAR (A) 09/19/2020 1225   LABSPEC 1.017 09/19/2020 1225   PHURINE 7.0 09/19/2020 1225   GLUCOSEU NEGATIVE 09/19/2020 1225   HGBUR NEGATIVE 09/19/2020 1225   BILIRUBINUR NEGATIVE 09/19/2020 1225   KETONESUR NEGATIVE 09/19/2020 1225   PROTEINUR NEGATIVE 09/19/2020 1225   NITRITE NEGATIVE 09/19/2020 1225   LEUKOCYTESUR NEGATIVE 09/19/2020 1225   Sepsis Labs Invalid input(s): PROCALCITONIN,  WBC,  LACTICIDVEN Microbiology Recent Results (from the past 240 hour(s))  Blood culture (single)     Status: None (Preliminary result)   Collection Time: 09/19/20  2:56 PM   Specimen: BLOOD  Result Value Ref Range Status   Specimen Description BLOOD LEFT ANTECUBITAL  Final   Special Requests   Final    BOTTLES DRAWN AEROBIC AND ANAEROBIC Blood Culture adequate volume   Culture   Final    NO GROWTH < 24 HOURS Performed at [facility], [address], [place], [place] [address]    Report Status PENDING  Incomplete  Resp Panel by RT-PCR (Flu A&B, Covid) Nasopharyngeal Swab     Status: None   Collection Time: 09/19/20  6:04 PM   Specimen: Nasopharyngeal Swab; Nasopharyngeal(NP) swabs in vial transport medium  Result Value Ref Range Status   SARS Coronavirus 2 by RT PCR NEGATIVE NEGATIVE Final    Comment: (NOTE) SARS-CoV-2 target nucleic acids are NOT DETECTED.  The SARS-CoV-2 RNA is generally detectable in upper respiratory specimens during the acute phase of infection. The lowest concentration of SARS-CoV-2 viral copies this assay can detect is 138 copies/mL. A negative result does not preclude SARS-Cov-2 infection and should not be used as the sole basis for treatment or other patient management decisions. A negative result may occur with  improper specimen collection/handling, submission of specimen other than nasopharyngeal  swab, presence of viral mutation(s) within the areas targeted by this assay, and inadequate number of viral copies(<138 copies/mL). A negative result must be combined with clinical observations, patient history, and epidemiological information. The expected result is Negative.  Fact Sheet for Patients:  BloggerCourse.com  Fact Sheet for Healthcare Providers:  SeriousBroker.it  This test is no t yet approved or cleared by the [place] FDA and  has been authorized for detection and/or diagnosis of SARS-CoV-2 by FDA under an Emergency Use Authorization (EUA). This EUA will remain  in effect (meaning this test can be used) for the duration of the COVID-19 declaration under Section 564(b)(1) of the Act, 21 U.S.C.section 360bbb-3(b)(1), unless the authorization is terminated  or revoked sooner.  Influenza A by PCR NEGATIVE NEGATIVE Final   Influenza B by PCR NEGATIVE NEGATIVE Final    Comment: (NOTE) The Xpert Xpress SARS-CoV-2/FLU/RSV plus assay is intended as an aid in the diagnosis of influenza from Nasopharyngeal swab specimens and should not be used as a sole basis for treatment. Nasal washings and aspirates are unacceptable for Xpert Xpress SARS-CoV-2/FLU/RSV testing.  Fact Sheet for Patients: BloggerCourse.comhttps://www.fda.gov/media/152166/download  Fact Sheet for Healthcare Providers: SeriousBroker.ithttps://www.fda.gov/media/152162/download  This test is not yet approved or cleared by the [place] FDA and has been authorized for detection and/or diagnosis of SARS-CoV-2 by FDA under an Emergency Use Authorization (EUA). This EUA will remain in effect (meaning this test can be used) for the duration of the COVID-19 declaration under Section 564(b)(1) of the Act, 21 U.S.C. section 360bbb-3(b)(1), unless the authorization is terminated or revoked.  Performed at [facility], [address] [place], [place] [address]       Time coordinating discharge: Over 30 minutes  SIGNED:   Tresa MooreSudheer B Brizza Nathanson, MD  [facility] 09/20/2020, 3:52 PM Pager   If 7PM-7AM, please contact night-coverage

## 2020-09-20 NOTE — Discharge Instructions (Signed)
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  The reason you have been confused and falling is known as hepatic encephalopathy.  This occurs when you have a buildup of toxins in your body from your liver not functioning appropriately.  It looks like on review of your medical record you were previously seen by our gastroenterologist approximately 6 months ago and recommended to be seen by a liver specialist at either [facility] or [facility].  It does not appear this was done.  You need to follow-up with your outpatient providers and take medications as directed otherwise your liver disease is likely to recur.   Hepatic Encephalopathy  Hepatic encephalopathy is a loss of brain function due to advanced liver disease. When the liver is damaged, harmful substances (toxins) can build up in the body. Some of these toxins, such as ammonia, can harm the brain. The effects of the condition depend on the type of liver damage and how severe it is. In some cases, hepatic encephalopathy can be reversed. What are the causes? Certain things can trigger or worsen hepatic encephalopathy, such as:  Infection.  Constipation.  Taking certain medicines, such as benzodiazepines.  Alcohol use.  Bleeding into the intestinal tract.  Imbalances in minerals (electrolytes) in the body.  Dehydration. Hepatic encephalopathy can sometimes be reversed if these triggers are resolved. What increases the risk? You are at risk of developing this condition if you have advanced liver disease (cirrhosis). Conditions that can cause liver disease include:  Infections in the liver, such as hepatitis C.  Infections in the blood.  Drinking a lot of alcohol over a long period of time.  Taking certain medicines, including tranquilizers, diuretics, antidepressants, sleeping pills, or acetaminophen.  Genetic diseases, such as Wilson's disease. What are the signs or symptoms? Symptoms may develop suddenly. Or, they may  develop slowly and get worse gradually. Symptoms can range from mild to severe. Mild symptoms include:  Mild confusion.  Shortened attention span.  Personality and mood changes.  Anxiety and agitation.  Drowsiness. Symptoms of worsening or severe hepatic encephalopathy include:  Extreme confusion (disorientation).  Slowed movement.  Slurred speech.  Extreme personality changes.  Abnormal shaking or flapping of the hands.  Coma. How is this diagnosed? This condition may be diagnosed based on:  A physical exam.  Your symptoms and medical history.  Blood tests. These may be done to check levels of ammonia in your blood, measure how long it takes your blood to clot, or check for infection.  Liver function tests. These may be done to check how well your liver is working.  MRI and CT scans. These may be done to check for a brain disorder and to check for problems with your liver.  Electroencephalogram (EEG). This test measures the electrical activity in your brain. How is this treated? The first step in treatment is to identify and treat the cause of your liver damage or triggering illness, if possible. The next step is taking medicine to lower the level of toxins in your body and prevent ammonia from building up. Treatment will depend on how severe your encephalopathy is, and may include:  Medicine to lower your ammonia level (lactulose).  Antibiotic medicine to reduce the amount of ammonia-producing bacteria in your gut.  Close monitoring of your blood pressure, heart rate, breathing, and oxygen levels.  Removal of fluid from your abdomen.  Close monitoring of how you think, feel, and act (mental status).  Dietary changes.  Liver transplant, in  severe cases. Follow these instructions at home: Eating and drinking   Work with a dietitian or with your health care provider to make sure you are getting the right balance of protein and minerals.  Drink enough fluids  to keep your urine pale yellow.  Do not drink alcohol or use drugs. General instructions  If you were prescribed an antibiotic, take it as told by your health care provider. Do not stop taking the antibiotic even if your condition improves.  Take other over-the-counter and prescription medicines only as told by your health care provider.  Do not start taking any new medicines, including over-the-counter medicines, without first checking with your health care provider.  Keep all follow-up visits as told by your health care provider. This is important. Contact a health care provider if:  You develop new symptoms.  Your symptoms change or get worse.  You have a fever.  You are constipated. Signs of constipation include having: ? Fewer bowel movements in a week than normal. ? Trouble having a bowel movement. ? Stools that are dry, hard, or larger than normal.  You have persistent nausea, vomiting, or diarrhea. Get help right away if:  You become very confused or drowsy.  You vomit blood or material that looks like coffee grounds.  Your stool is bloody, black, or looks like tar. Summary  Hepatic encephalopathy is a loss of brain function due to advanced liver disease. When the liver is damaged, harmful substances (toxins) can build up in your body. Some of these toxins, such as ammonia, can harm your brain.  Certain things can trigger or worsen hepatic encephalopathy. Hepatic encephalopathy can sometimes be reversed if these triggers are resolved.  The first step in treatment is to identify and treat the cause of your liver damage or triggering illness, if possible. The next step is taking medicine to lower the level of toxins in your body and prevent ammonia from building up.  Your treatment will depend on how severe your hepatic encephalopathy is. This information is not intended to replace advice given to you by your health care provider. Make sure you discuss any questions  you have with your health care provider. Document Revised: 08/27/2017 Document Reviewed: 06/15/2017 Elsevier Patient Education  2020 [employer]   Cirrhosis  Cirrhosis is long-term (chronic) liver injury. The liver is the body's largest internal organ, and it performs many functions. It converts food into energy, removes toxic material from the blood, makes important proteins, and absorbs necessary vitamins from food. In cirrhosis, healthy liver cells are replaced by scar tissue. This prevents blood from flowing through the liver, making it difficult for the liver to function. Scarring of the liver cannot be reversed, but treatment can prevent it from getting worse. What are the causes? Common causes of this condition are hepatitis C and long-term alcohol abuse. Other causes include:  Nonalcoholic fatty liver disease. This happens when fat is deposited in the liver by causes other than alcohol.  Hepatitis B infection.  Autoimmune hepatitis. In this condition, the body's defense system (immune system) mistakenly attacks the liver cells, causing irritation and swelling (inflammation).  Diseases that cause blockage of ducts inside the liver.  Inherited liver diseases, such as hemochromatosis. This is one of the most common inherited liver diseases. In this disease, deposits of iron collect in the liver and other organs.  Reactions to certain long-term medicines, such as amiodarone, a heart medicine.  Parasitic infections. These include schistosomiasis, which is caused by a flatworm.  Long-term contact to certain toxins. These toxins include certain organic solvents, such as toluene and chloroform. What increases the risk? You are more likely to develop this condition if:  You have certain types of viral hepatitis.  You abuse alcohol, especially if you are male.  You are overweight.  You share needles.  You have unprotected sex with someone who has viral hepatitis. What are  the signs or symptoms? You may not have any signs and symptoms at first. Symptoms may not develop until the damage to your liver starts to get worse. Early symptoms may include:  Weakness and tiredness (fatigue).  Changes in sleep patterns or having trouble sleeping.  Itchiness.  Tenderness in the right-upper part of your abdomen.  Weight loss and muscle loss.  Nausea.  Loss of appetite.  Appearance of tiny blood vessels under the skin. Later symptoms may include:  Fatigue or weakness that is getting worse.  Yellow skin and eyes (jaundice).  Buildup of fluid in the abdomen (ascites). You may notice that your clothes are tight around your waist.  Weight gain.  Swelling of the feet and ankles (edema).  Trouble breathing.  Easy bruising and bleeding.  Vomiting blood.  Black or bloody stool.  Mental confusion. How is this diagnosed? Your health care provider may suspect cirrhosis based on your symptoms and medical history, especially if you have other medical conditions or a history of alcohol abuse. Your health care provider will do a physical exam to feel your liver and to check for signs of cirrhosis. He or she may perform other tests, including:  Blood tests to check: ? For hepatitis B or C. ? Kidney function. ? Liver function.  Imaging tests such as: ? MRI or CT scan to look for changes seen in advanced cirrhosis. ? Ultrasound to see if normal liver tissue is being replaced by scar tissue.  A procedure in which a long needle is used to take a sample of liver tissue to be checked in a lab (biopsy). Liver biopsy can confirm the diagnosis of cirrhosis. How is this treated? Treatment for this condition depends on how damaged your liver is and what caused the damage. It may include treating the symptoms of cirrhosis, or treating the underlying causes in order to slow the damage. Treatment may include:  Making lifestyle changes, such as: ? Eating a healthy diet.  You may need to work with your health care provider or a diet and nutrition specialist (dietitian) to develop an eating plan. ? Restricting salt intake. ? Maintaining a healthy weight. ? Not abusing drugs or alcohol.  Taking medicines to: ? Treat liver infections or other infections. ? Control itching. ? Reduce fluid buildup. ? Reduce certain blood toxins. ? Reduce risk of bleeding from enlarged blood vessels in the stomach or esophagus (varices).  Liver transplant. In this procedure, a liver from a donor is used to replace your diseased liver. This is done if cirrhosis has caused liver failure. Other treatments and procedures may be done depending on the problems that you get from cirrhosis. Common problems include liver-related kidney failure (hepatorenal syndrome). Follow these instructions at home:   Take medicines only as told by your health care provider. Do not use medicines that are toxic to your liver. Ask your health care provider before taking any new medicines, including over-the-counter medicines.  Rest as needed.  Eat a well-balanced diet. Ask your health care provider or dietitian for more information.  Limit your salt or water intake, if  your health care provider asks you to do this.  Do not drink alcohol. This is especially important if you are taking acetaminophen.  Keep all follow-up visits as told by your health care provider. This is important. Contact a health care provider if you:  Have fatigue or weakness that is getting worse.  Develop swelling of the hands, feet, legs, or face.  Have a fever.  Develop loss of appetite.  Have nausea or vomiting.  Develop jaundice.  Develop easy bruising or bleeding. Get help right away if you:  Vomit bright red blood or a material that looks like coffee grounds.  Have blood in your stools.  Notice that your stools appear black and tarry.  Become confused.  Have chest pain or trouble  breathing. Summary  Cirrhosis is chronic liver injury. Liver damage cannot be reversed. Common causes are hepatitis C and long-term alcohol abuse.  Tests used to diagnose cirrhosis include blood tests, imaging tests, and liver biopsy.  Treatment for this condition involves treating the underlying cause. Avoid alcohol, drugs, salt, and medicines that may damage your liver.  Contact your health care provider if you develop ascites, edema, jaundice, fever, nausea or vomiting, easy bruising or bleeding, or worsening fatigue. This information is not intended to replace advice given to you by your health care provider. Make sure you discuss any questions you have with your health care provider. Document Revised: 01/04/2019 Document Reviewed: 08/04/2017 Elsevier Patient Education  2020 [employer].

## 2020-09-20 NOTE — ED Notes (Signed)
RN to bedside for bed alarm, pt attempting to get out of bed], redirected. Bed alarm and call bell in place

## 2020-09-21 LAB — URINE CULTURE: Culture: <10000 — AB

## 2020-09-24 LAB — CULTURE, BLOOD (SINGLE)
Culture: NO GROWTH
Special Requests: ADEQUATE

## 2021-01-01 ENCOUNTER — Encounter: Payer: Self-pay | Admitting: *Deleted

## 2021-01-01 ENCOUNTER — Encounter: Payer: Self-pay | Admitting: Gastroenterology

## 2021-01-06 ENCOUNTER — Encounter: Payer: Self-pay | Admitting: *Deleted

## 2021-01-16 ENCOUNTER — Other Ambulatory Visit: Payer: Self-pay

## 2021-03-30 ENCOUNTER — Other Ambulatory Visit: Payer: Self-pay

## 2021-03-30 ENCOUNTER — Emergency Department
Admission: EM | Admit: 2021-03-30 | Discharge: 2021-03-31 | Disposition: A | Discharge status: Home / Self Care | Payer: Medicare Other | Attending: Emergency Medicine | Admitting: Emergency Medicine

## 2021-03-30 DIAGNOSIS — I10 Essential (primary) hypertension: Secondary | ICD-10-CM | POA: Insufficient documentation

## 2021-03-30 DIAGNOSIS — Z79899 Other long term (current) drug therapy: Secondary | ICD-10-CM | POA: Insufficient documentation

## 2021-03-30 DIAGNOSIS — R319 Hematuria, unspecified: Secondary | ICD-10-CM | POA: Insufficient documentation

## 2021-03-30 DIAGNOSIS — R82998 Other abnormal findings in urine: Secondary | ICD-10-CM | POA: Insufficient documentation

## 2021-03-30 DIAGNOSIS — R8290 Unspecified abnormal findings in urine: Secondary | ICD-10-CM

## 2021-03-30 DIAGNOSIS — F17210 Nicotine dependence, cigarettes, uncomplicated: Secondary | ICD-10-CM | POA: Diagnosis not present

## 2021-03-30 LAB — CBC
HCT: 32.3 % — ABNORMAL LOW (ref 39.0–52.0)
Hemoglobin: 10.6 g/dL — ABNORMAL LOW (ref 13.0–17.0)
MCH: 26.6 pg (ref 26.0–34.0)
MCHC: 32.8 g/dL (ref 30.0–36.0)
MCV: 81.2 fL (ref 80.0–100.0)
Platelets: 138 10*3/uL — ABNORMAL LOW (ref 150–400)
RBC: 3.98 MIL/uL — ABNORMAL LOW (ref 4.22–5.81)
RDW: 20.8 % — ABNORMAL HIGH (ref 11.5–15.5)
WBC: 5.6 10*3/uL (ref 4.0–10.5)
nRBC: 0 % (ref 0.0–0.2)

## 2021-03-30 LAB — BASIC METABOLIC PANEL
Anion gap: 6 (ref 5–15)
BUN: 20 mg/dL (ref 8–23)
CO2: 21 mmol/L — ABNORMAL LOW (ref 22–32)
Calcium: 9.3 mg/dL (ref 8.9–10.3)
Chloride: 103 mmol/L (ref 98–111)
Creatinine, Ser: 1.14 mg/dL (ref 0.61–1.24)
GFR, Estimated: >60 mL/min (ref >60)
Glucose, Bld: 98 mg/dL (ref 70–99)
Potassium: 4.6 mmol/L (ref 3.5–5.1)
Sodium: 130 mmol/L — ABNORMAL LOW (ref 135–145)

## 2021-03-30 NOTE — ED Triage Notes (Signed)
From home via ems, girlfriend told ems that pt has had a uti x51mo and continues to have dark foul urine with trace blood and has been noncompliant. Pt denies any complaints or difficulty urinating, reports only taking 1 pill in am

## 2021-03-31 LAB — URINALYSIS, COMPLETE (UACMP) WITH MICROSCOPIC
Bacteria, UA: NONE SEEN
Bilirubin Urine: NEGATIVE
Glucose, UA: NEGATIVE mg/dL
Hgb urine dipstick: NEGATIVE
Ketones, ur: 5 mg/dL — AB
Leukocytes,Ua: NEGATIVE
Nitrite: NEGATIVE
Protein, ur: NEGATIVE mg/dL
Specific Gravity, Urine: 1.024 (ref 1.005–1.030)
pH: 5 (ref 5.0–8.0)

## 2021-03-31 NOTE — ED Notes (Signed)
EDP made aware of patient's HR, upon review of med list with EDP, pt take propanolol, pt has not had his propanolol while in ED. Per EDP pt okay for discharge. Pt also noted repeatedly ambulating out of room and stating he wants to leave, pt redirected repeatedly back to room. EDP also aware. NAD noted at time of D/C. Pt taken to lobby to use courtesy phone to call for a ride.

## 2021-03-31 NOTE — Discharge Instructions (Addendum)
Your blood work did not show any acute abnormalities.  Your urine did not have any signs of infection or blood.  Follow-up with your doctor in 3 days for reevaluation.  Return to the emergency room for pain or burning with urination, fever, abdominal pain, nausea or vomiting
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
## 2022-06-21 IMAGING — CT CT HEAD W/O CM
3 series · 15 of 46 positions shown, 18 images · non-contrast
Comparison: None.

CLINICAL DATA: Altered mental status

EXAM:
CT HEAD WITHOUT CONTRAST
TECHNIQUE: Contiguous axial images were obtained from the base of the skull
through the vertex without intravenous contrast.

[Series 2: head wo · axial · 0.42mm/px · z∈[-121,-1]mm · 9 of 29 slices shown, 12 images]
[im 3/29  brain]
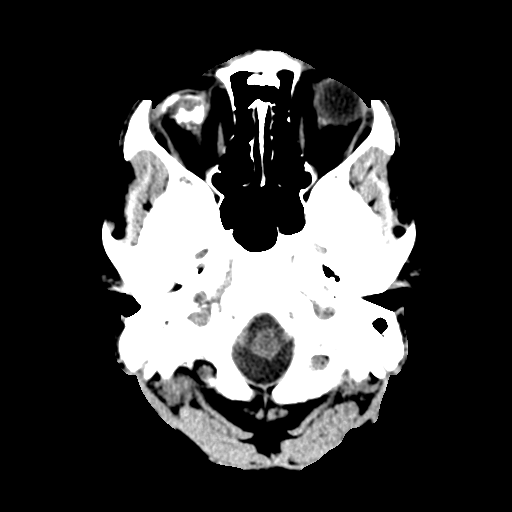
[im 3/29  bone]
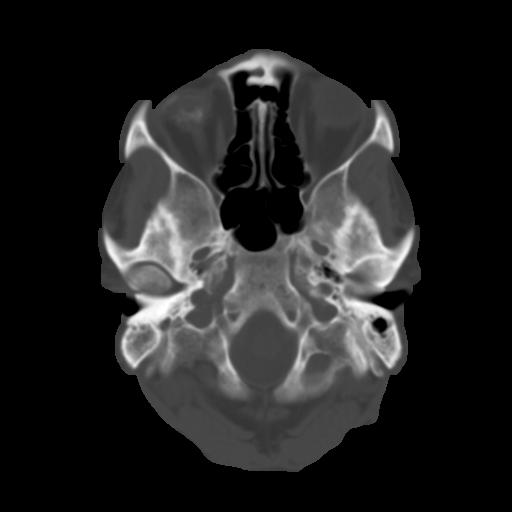
[im 6/29  brain]
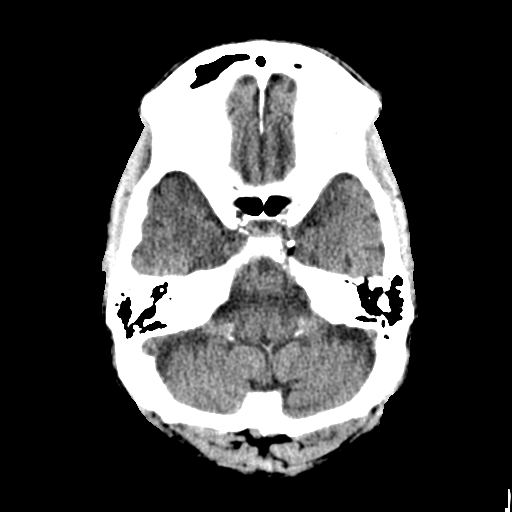
[im 9/29  brain]
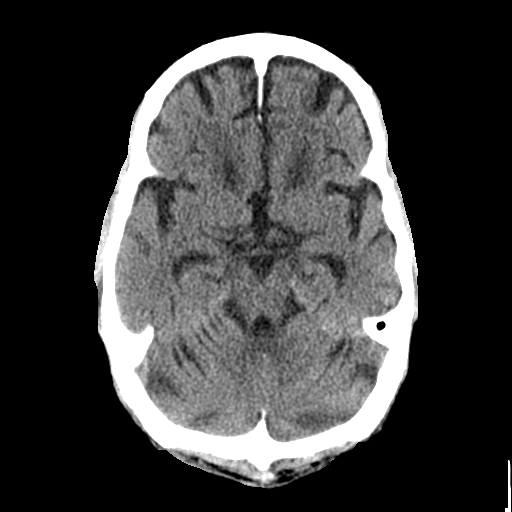
[im 12/29  brain]
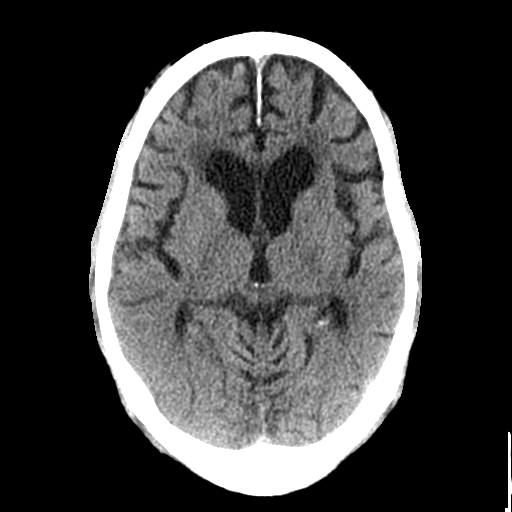
[im 15/29  brain]
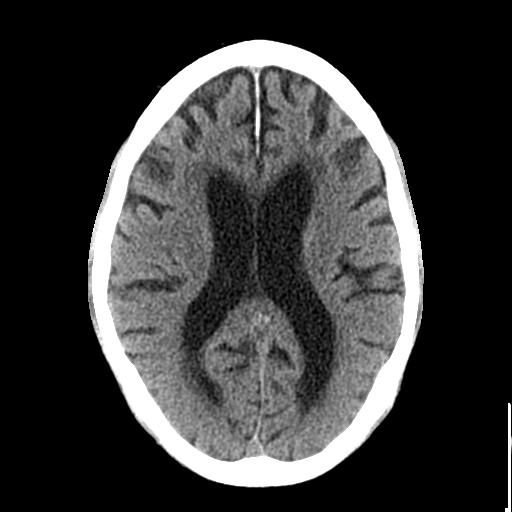
[im 15/29  bone]
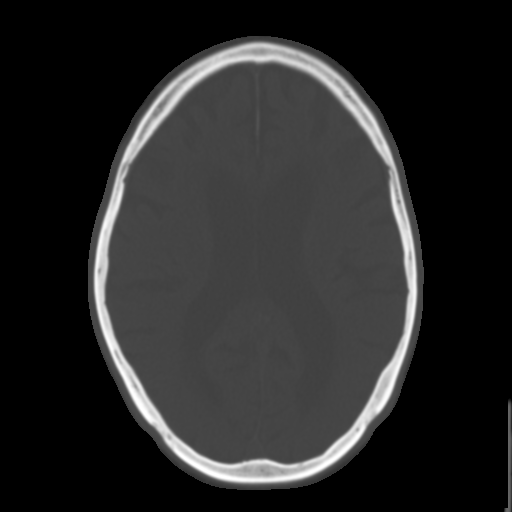
[im 18/29  brain]
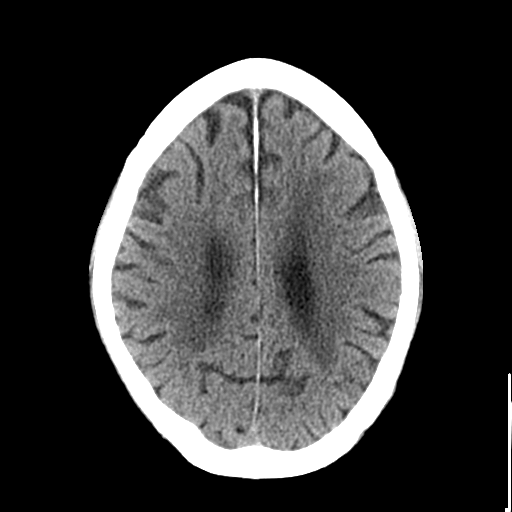
[im 21/29  brain]
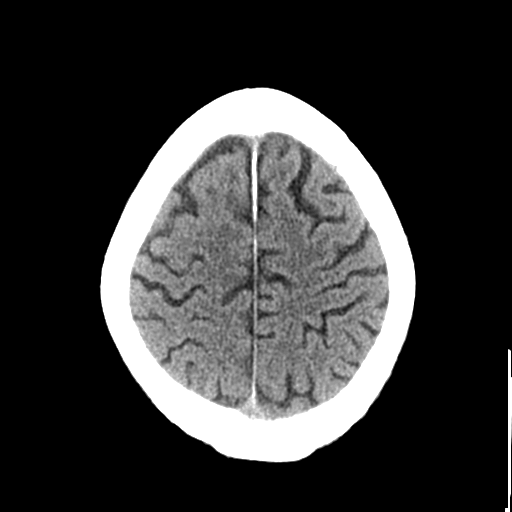
[im 24/29  brain]
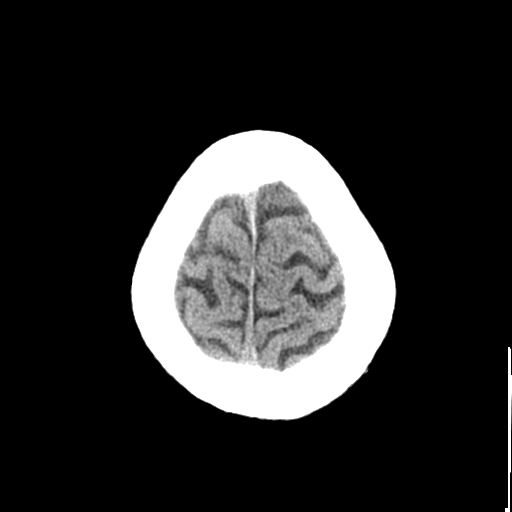
[im 27/29  brain]
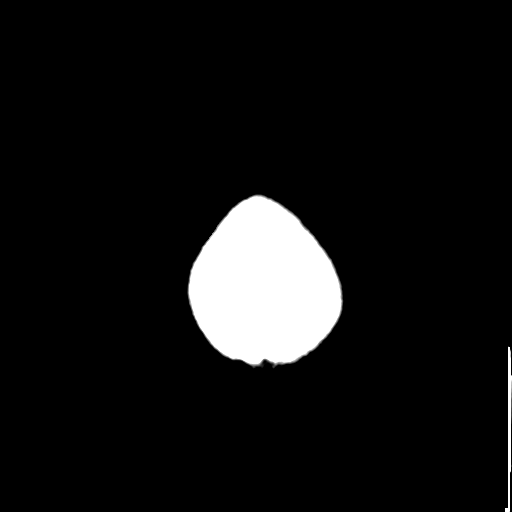
[im 27/29  bone]
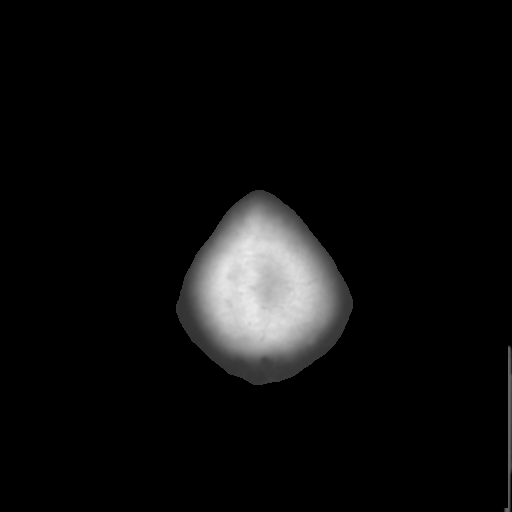

[Series 4: coronal soft tissue · coronal · 0.30mm/px · 3 of 68 slices shown]
[im 23/68  brain]
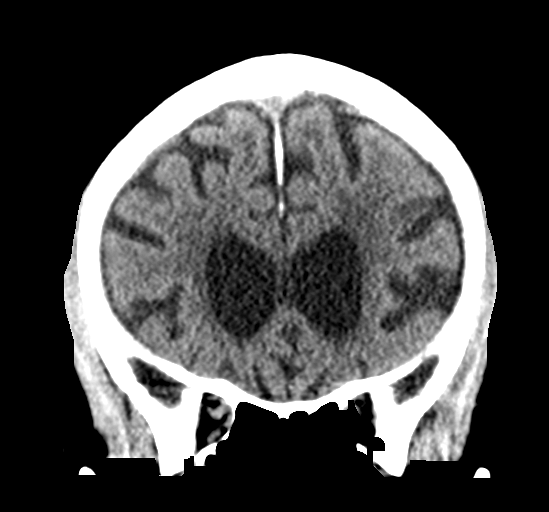
[im 30/68  brain]
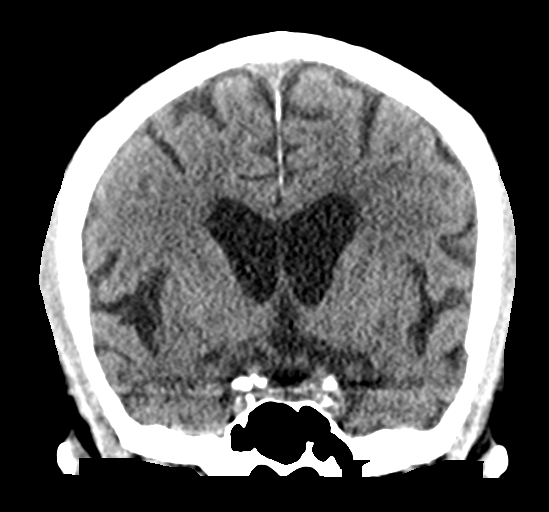
[im 38/68  brain]
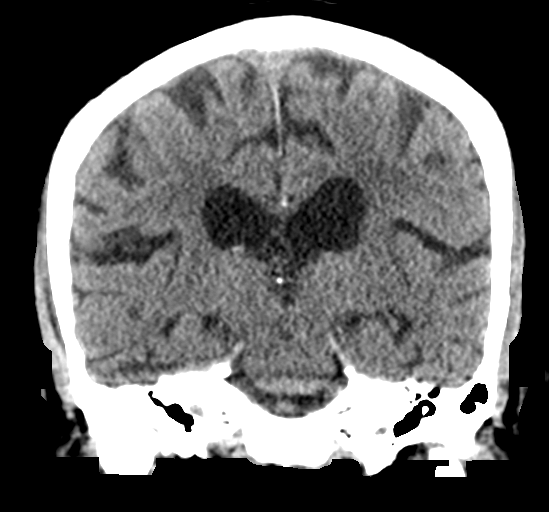

[Series 5: sagittal soft tissue · sagittal · 0.29mm/px · 3 of 56 slices shown]
[im 19/56  brain]
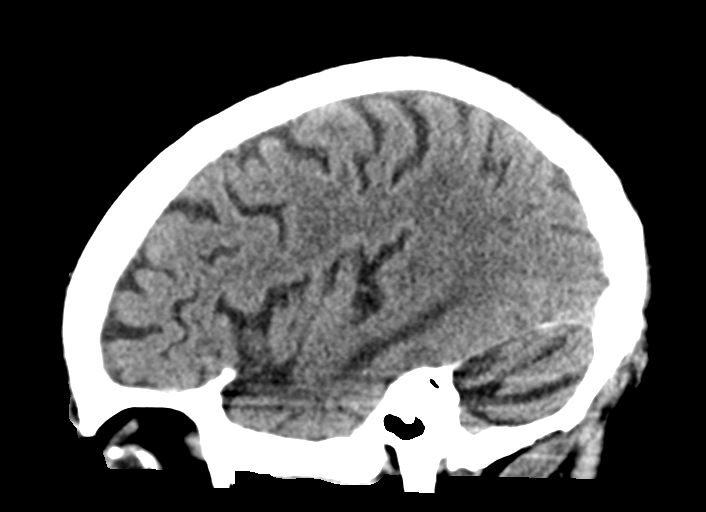
[im 28/56  brain]
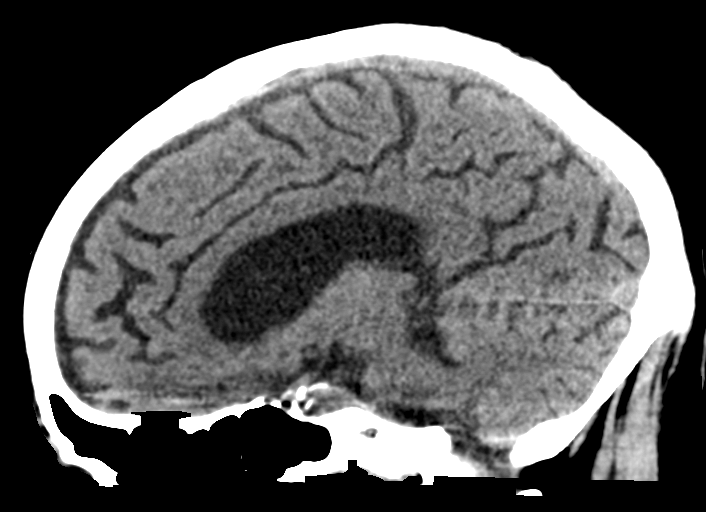
[im 37/56  brain]
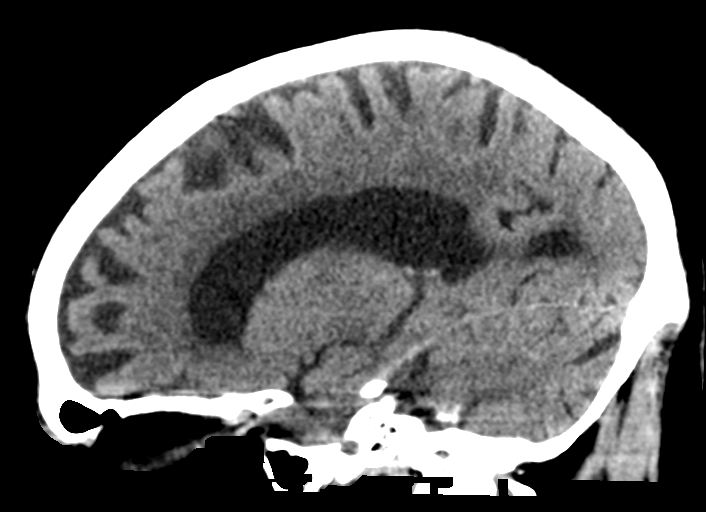

[15 of 46 positions shown; findings below may reference images not displayed]

FINDINGS: Brain: Mild atrophic changes and chronic white matter ischemic
change is noted. No findings to suggest acute hemorrhage, acute
infarction or space-occupying mass lesion noted.

Vascular: No hyperdense vessel or unexpected calcification.

Skull: Normal. Negative for fracture or focal lesion.

Sinuses/Orbits: Calcified right globe is noted.  Sinuses are clear.

Other: None
IMPRESSION: Chronic atrophic and ischemic changes without acute abnormality.
